# Patient Record
Sex: Female | Born: 1960 | Race: White | Hispanic: No | Marital: Married | Smoking: Never smoker
Health system: Southern US, Academic
[De-identification: ages and names within clinical notes are randomized; demographics above are authoritative.]

## PROBLEM LIST (undated history)

## (undated) DIAGNOSIS — K219 Gastro-esophageal reflux disease without esophagitis: Secondary | ICD-10-CM

## (undated) DIAGNOSIS — M810 Age-related osteoporosis without current pathological fracture: Secondary | ICD-10-CM

## (undated) DIAGNOSIS — E782 Mixed hyperlipidemia: Secondary | ICD-10-CM

## (undated) DIAGNOSIS — J449 Chronic obstructive pulmonary disease, unspecified: Secondary | ICD-10-CM

## (undated) DIAGNOSIS — Z86010 Personal history of colonic polyps: Secondary | ICD-10-CM

## (undated) DIAGNOSIS — J45909 Unspecified asthma, uncomplicated: Secondary | ICD-10-CM

## (undated) DIAGNOSIS — R000 Tachycardia, unspecified: Secondary | ICD-10-CM

## (undated) DIAGNOSIS — K5792 Diverticulitis of intestine, part unspecified, without perforation or abscess without bleeding: Secondary | ICD-10-CM

## (undated) DIAGNOSIS — M779 Enthesopathy, unspecified: Secondary | ICD-10-CM

## (undated) DIAGNOSIS — I959 Hypotension, unspecified: Secondary | ICD-10-CM

## (undated) DIAGNOSIS — E559 Vitamin D deficiency, unspecified: Secondary | ICD-10-CM

## (undated) HISTORY — PX: HX APPENDECTOMY: SHX54

## (undated) HISTORY — DX: Age-related osteoporosis without current pathological fracture: M81.0

## (undated) HISTORY — PX: HX GALL BLADDER SURGERY/CHOLE: SHX55

## (undated) HISTORY — DX: Tachycardia, unspecified: R00.0

## (undated) HISTORY — DX: Unspecified asthma, uncomplicated: J45.909

## (undated) HISTORY — PX: COLONOSCOPY: SHX174

## (undated) HISTORY — DX: Mixed hyperlipidemia: E78.2

## (undated) HISTORY — PX: HX TONSIL AND ADENOIDECTOMY: SHX28

## (undated) HISTORY — PX: HX TONSILLECTOMY: SHX27

## (undated) HISTORY — DX: Vitamin D deficiency, unspecified: E55.9

## (undated) HISTORY — DX: Hypotension, unspecified: I95.9

## (undated) HISTORY — DX: Gastro-esophageal reflux disease without esophagitis: K21.9

## (undated) HISTORY — PX: HX HYSTERECTOMY: SHX81

## (undated) HISTORY — DX: Chronic obstructive pulmonary disease, unspecified (CMS HCC): J44.9

## (undated) HISTORY — DX: Personal history of colonic polyps: Z86.010

---

## 1993-03-29 ENCOUNTER — Other Ambulatory Visit (HOSPITAL_COMMUNITY): Payer: Self-pay

## 2021-09-06 ENCOUNTER — Other Ambulatory Visit (HOSPITAL_COMMUNITY): Payer: Self-pay | Admitting: Obstetrics & Gynecology

## 2021-09-06 DIAGNOSIS — N644 Mastodynia: Secondary | ICD-10-CM

## 2021-09-10 ENCOUNTER — Other Ambulatory Visit (HOSPITAL_COMMUNITY): Payer: Self-pay | Admitting: Obstetrics & Gynecology

## 2021-09-10 DIAGNOSIS — N644 Mastodynia: Secondary | ICD-10-CM

## 2021-09-12 ENCOUNTER — Ambulatory Visit (HOSPITAL_COMMUNITY): Payer: Self-pay

## 2021-10-17 ENCOUNTER — Ambulatory Visit (HOSPITAL_COMMUNITY): Payer: Self-pay

## 2021-11-29 DIAGNOSIS — Z1211 Encounter for screening for malignant neoplasm of colon: Secondary | ICD-10-CM | POA: Insufficient documentation

## 2021-11-29 DIAGNOSIS — K219 Gastro-esophageal reflux disease without esophagitis: Secondary | ICD-10-CM | POA: Insufficient documentation

## 2021-11-29 DIAGNOSIS — R1310 Dysphagia, unspecified: Secondary | ICD-10-CM | POA: Insufficient documentation

## 2021-11-29 NOTE — H&P (View-Only) (Signed)
GENERAL SURGERY, [address] [facility] [address]  [place] [id number]       Name: Melinda Berg MRN:  [id number]   Date: 12/03/2021 Age: 61 y.o. 07-01-1960      PCP: Jonn Shingles, NP     Subjective  Melinda Berg is a 61 y.o. year old female who presents for complaints of ongoing left upper quadrant abdominal pain radiating to her left flank.  She states she was told in the past this related to a "hiatal hernia. "  Has had previous EGDs to evaluate this but has been greater than 10 years since her last.  Several of these EGDs involved esophageal dilatations.  Has ongoing gastroesophageal reflux symptoms.  She takes proton pump inhibitors for this with some control of her symptoms.  Her left upper quadrant abdominal pain is unrelated to oral intake.      In addition has not had a colonoscopy in greater than 10 years.  States she had colorectal polyps on her last exam.  Denies rectal bleeding.  No unexplained weight loss.  No family history of colon cancer.    Medical decision  Review of the result(s) of each unique test:  Patient underwent diagnostic testing (previous endoscopy report) prior to this dates visit.  I have personally reviewed the results and that serves as a component of the medical decision making for this encounter     Review of prior external note(s) from each unique source:  Patients referral to this office including a recent assessment by the referring provider.  This was reviewed by me for this unique office visit for the indication and intent of the referral as well as any pertinent medical or surgical history relevant to the patients independent evaluation by me today.     Allergies   Allergen Reactions   . Meperidine Shortness of Breath   . Penicillins Rash   . Acetaminophen    . Aspirin-Acetaminophen-Caffeine    . Butorphanol      dizzy      Current Outpatient Medications   Medication Sig   . alendronate (FOSAMAX) 70 mg Oral Tablet    . EPINEPHrine 0.3 mg/0.3  mL Injection Auto-Injector Inject 0.3 mL (0.3 mg total) into the muscle   . estradioL (ESTRACE) 1 mg Oral Tablet    . famotidine (PEPCID) 40 mg Oral Tablet    . furosemide (LASIX) 80 mg Oral Tablet    . metoprolol succinate (TOPROL-XL) 50 mg Oral Tablet Sustained Release 24 hr    . midodrine (PROAMITINE) 5 mg Oral Tablet    . nitrofurantoin monohyd/m-cryst (MACROBID) 100 mg Oral Capsule    . pantoprazole (PROTONIX) 40 mg Oral Tablet, Delayed Release (E.C.)    . potassium chloride (K-DUR) 20 mEq Oral Tab Sust.Rel. Particle/Crystal    . sodium,potassium,mag sulfates (SUPREP BOWEL PREP KIT) 17.5-3.13-1.6 gram Oral Recon Soln Take 1 Bottle by mouth Every 12 hours   . VITAMIN D 1,250 mcg (50,000 unit) Oral Capsule           Objective:       Vitals:    12/03/21 1427   BP: 126/80   Pulse: 89   Temp: 36.8 C (98.2 F)   SpO2: 95%   Weight: 68.1 kg (150 lb 3.2 oz)   Height: 1.6 m (_0 )   BMI: 26.66        General: appropriate for age. in no acute distress.    Vital signs are present above  and have been reviewed by me     HEENT: Atraumatic, Normocephalic.    Lungs: Nonlabored breathing with symmetric expansion    Heart:Regular wth respect to rate and rythmn.    Abdomen:Soft. Nontender. Nondistended     Psychiatric: Alert and oriented to person, place, and time. affect appropriate    Assessment/Plan    ICD-10-CM    1. GERD (gastroesophageal reflux disease)  K21.9       2. Screening for malignant neoplasm of colon  Z12.11            Discussed indications, risks and benefits of esophagogastroduodenoscopy and colonoscopy with the patient.  Discussed the possibility of polypectomy, biopsies, and possible repeat examinations.  Risks include bleeding, sedation risks, possibility of missed diagnosis of polyp or malignancy, and remote possibilities of perforation and death.  All questions were answered, and informed consent was clearly obtained.    Office Visit was used for detailed explanation procedure and its indications, review  of the patient's medications relative to the time before and after the procedure, and the effects of the associated medical conditions that affect the procedure preparation and procedure itself.    Bridgett Larsson MD MBA CPE FACS

## 2021-11-29 NOTE — H&P (Signed)
GENERAL SURGERY, [address] [facility] [address]  [place] [id number]       Name: MONSERATH NEFF MRN:  [id number]   Date: 12/03/2021 Age: 61 y.o. 07-01-1960      PCP: Jonn Shingles, NP     Subjective  Melinda Berg is a 61 y.o. year old female who presents for complaints of ongoing left upper quadrant abdominal pain radiating to her left flank.  She states she was told in the past this related to a "hiatal hernia. "  Has had previous EGDs to evaluate this but has been greater than 10 years since her last.  Several of these EGDs involved esophageal dilatations.  Has ongoing gastroesophageal reflux symptoms.  She takes proton pump inhibitors for this with some control of her symptoms.  Her left upper quadrant abdominal pain is unrelated to oral intake.      In addition has not had a colonoscopy in greater than 10 years.  States she had colorectal polyps on her last exam.  Denies rectal bleeding.  No unexplained weight loss.  No family history of colon cancer.    Medical decision  Review of the result(s) of each unique test:  Patient underwent diagnostic testing (previous endoscopy report) prior to this dates visit.  I have personally reviewed the results and that serves as a component of the medical decision making for this encounter     Review of prior external note(s) from each unique source:  Patients referral to this office including a recent assessment by the referring provider.  This was reviewed by me for this unique office visit for the indication and intent of the referral as well as any pertinent medical or surgical history relevant to the patients independent evaluation by me today.     Allergies   Allergen Reactions   . Meperidine Shortness of Breath   . Penicillins Rash   . Acetaminophen    . Aspirin-Acetaminophen-Caffeine    . Butorphanol      dizzy      Current Outpatient Medications   Medication Sig   . alendronate (FOSAMAX) 70 mg Oral Tablet    . EPINEPHrine 0.3 mg/0.3  mL Injection Auto-Injector Inject 0.3 mL (0.3 mg total) into the muscle   . estradioL (ESTRACE) 1 mg Oral Tablet    . famotidine (PEPCID) 40 mg Oral Tablet    . furosemide (LASIX) 80 mg Oral Tablet    . metoprolol succinate (TOPROL-XL) 50 mg Oral Tablet Sustained Release 24 hr    . midodrine (PROAMITINE) 5 mg Oral Tablet    . nitrofurantoin monohyd/m-cryst (MACROBID) 100 mg Oral Capsule    . pantoprazole (PROTONIX) 40 mg Oral Tablet, Delayed Release (E.C.)    . potassium chloride (K-DUR) 20 mEq Oral Tab Sust.Rel. Particle/Crystal    . sodium,potassium,mag sulfates (SUPREP BOWEL PREP KIT) 17.5-3.13-1.6 gram Oral Recon Soln Take 1 Bottle by mouth Every 12 hours   . VITAMIN D 1,250 mcg (50,000 unit) Oral Capsule           Objective:       Vitals:    12/03/21 1427   BP: 126/80   Pulse: 89   Temp: 36.8 C (98.2 F)   SpO2: 95%   Weight: 68.1 kg (150 lb 3.2 oz)   Height: 1.6 m (_0 )   BMI: 26.66        General: appropriate for age. in no acute distress.    Vital signs are present above  and have been reviewed by me     HEENT: Atraumatic, Normocephalic.    Lungs: Nonlabored breathing with symmetric expansion    Heart:Regular wth respect to rate and rythmn.    Abdomen:Soft. Nontender. Nondistended     Psychiatric: Alert and oriented to person, place, and time. affect appropriate    Assessment/Plan    ICD-10-CM    1. GERD (gastroesophageal reflux disease)  K21.9       2. Screening for malignant neoplasm of colon  Z12.11            Discussed indications, risks and benefits of esophagogastroduodenoscopy and colonoscopy with the patient.  Discussed the possibility of polypectomy, biopsies, and possible repeat examinations.  Risks include bleeding, sedation risks, possibility of missed diagnosis of polyp or malignancy, and remote possibilities of perforation and death.  All questions were answered, and informed consent was clearly obtained.    Office Visit was used for detailed explanation procedure and its indications, review  of the patient's medications relative to the time before and after the procedure, and the effects of the associated medical conditions that affect the procedure preparation and procedure itself.    Bridgett Larsson MD MBA CPE FACS

## 2021-11-30 ENCOUNTER — Encounter (INDEPENDENT_AMBULATORY_CARE_PROVIDER_SITE_OTHER): Payer: Self-pay | Admitting: Surgery

## 2021-12-03 ENCOUNTER — Other Ambulatory Visit: Payer: Self-pay

## 2021-12-03 ENCOUNTER — Encounter (INDEPENDENT_AMBULATORY_CARE_PROVIDER_SITE_OTHER): Payer: Self-pay | Admitting: Surgery

## 2021-12-03 ENCOUNTER — Ambulatory Visit (INDEPENDENT_AMBULATORY_CARE_PROVIDER_SITE_OTHER): Payer: BC Managed Care – PPO | Admitting: Surgery

## 2021-12-03 DIAGNOSIS — R1310 Dysphagia, unspecified: Secondary | ICD-10-CM

## 2021-12-03 DIAGNOSIS — Z1211 Encounter for screening for malignant neoplasm of colon: Secondary | ICD-10-CM

## 2021-12-03 DIAGNOSIS — K219 Gastro-esophageal reflux disease without esophagitis: Secondary | ICD-10-CM

## 2021-12-03 MED ORDER — SODIUM,POTASSIUM,MAG SULFATES 17.5 GRAM-3.13 GRAM-1.6 GRAM ORAL SOLN
1.0000 | Freq: Two times a day (BID) | ORAL | 0 refills | Status: DC
Start: 2021-12-03 — End: 2021-12-13

## 2021-12-13 ENCOUNTER — Encounter (HOSPITAL_COMMUNITY): Payer: BC Managed Care – PPO | Admitting: Surgery

## 2021-12-13 ENCOUNTER — Other Ambulatory Visit: Payer: Self-pay

## 2021-12-13 ENCOUNTER — Inpatient Hospital Stay
Admission: RE | Admit: 2021-12-13 | Discharge: 2021-12-13 | Disposition: A | Discharge status: Home / Self Care | Payer: BC Managed Care – PPO | Source: Ambulatory Visit | Attending: Surgery | Admitting: Surgery

## 2021-12-13 ENCOUNTER — Encounter (HOSPITAL_COMMUNITY): Payer: Self-pay | Admitting: Surgery

## 2021-12-13 ENCOUNTER — Encounter (HOSPITAL_COMMUNITY)
Admission: RE | Disposition: A | Discharge status: Home / Self Care | Payer: Self-pay | Source: Ambulatory Visit | Attending: Surgery

## 2021-12-13 ENCOUNTER — Ambulatory Visit (HOSPITAL_COMMUNITY): Payer: BC Managed Care – PPO | Admitting: Certified Registered"

## 2021-12-13 DIAGNOSIS — R1310 Dysphagia, unspecified: Secondary | ICD-10-CM | POA: Insufficient documentation

## 2021-12-13 DIAGNOSIS — K219 Gastro-esophageal reflux disease without esophagitis: Secondary | ICD-10-CM | POA: Insufficient documentation

## 2021-12-13 DIAGNOSIS — Z1211 Encounter for screening for malignant neoplasm of colon: Secondary | ICD-10-CM | POA: Insufficient documentation

## 2021-12-13 DIAGNOSIS — K5730 Diverticulosis of large intestine without perforation or abscess without bleeding: Secondary | ICD-10-CM | POA: Insufficient documentation

## 2021-12-13 DIAGNOSIS — K2950 Unspecified chronic gastritis without bleeding: Secondary | ICD-10-CM | POA: Insufficient documentation

## 2021-12-13 DIAGNOSIS — Z9889 Other specified postprocedural states: Secondary | ICD-10-CM

## 2021-12-13 DIAGNOSIS — K449 Diaphragmatic hernia without obstruction or gangrene: Secondary | ICD-10-CM | POA: Insufficient documentation

## 2021-12-13 DIAGNOSIS — K2970 Gastritis, unspecified, without bleeding: Secondary | ICD-10-CM

## 2021-12-13 DIAGNOSIS — K648 Other hemorrhoids: Secondary | ICD-10-CM | POA: Insufficient documentation

## 2021-12-13 HISTORY — DX: Diverticulitis of intestine, part unspecified, without perforation or abscess without bleeding: K57.92

## 2021-12-13 HISTORY — DX: Enthesopathy, unspecified: M77.9

## 2021-12-13 SURGERY — GASTROSCOPY WITH BIOPSY
Anesthesia: General | Wound class: Clean Contaminated Wounds-The respiratory, GI, Genital, or urinary

## 2021-12-13 MED ORDER — PROPOFOL 10 MG/ML INTRAVENOUS EMULSION
Freq: Once | INTRAVENOUS | Status: DC | PRN
Start: 2021-12-13 — End: 2021-12-13
  Administered 2021-12-13: 200 mg via INTRAVENOUS

## 2021-12-13 MED ORDER — PROPOFOL 10 MG/ML IV BOLUS
INJECTION | Freq: Once | INTRAVENOUS | Status: DC | PRN
Start: 2021-12-13 — End: 2021-12-13
  Administered 2021-12-13: 50 mg via INTRAVENOUS

## 2021-12-13 MED ORDER — LIDOCAINE (PF) 100 MG/5 ML (2 %) INTRAVENOUS SYRINGE
INJECTION | Freq: Once | INTRAVENOUS | Status: DC | PRN
Start: 2021-12-13 — End: 2021-12-13
  Administered 2021-12-13: 90 mg via INTRAVENOUS

## 2021-12-13 MED ORDER — SODIUM CHLORIDE 0.9 % INTRAVENOUS SOLUTION
INTRAVENOUS | Status: DC
Start: 2021-12-13 — End: 2021-12-13

## 2021-12-13 SURGICAL SUPPLY — 5 items
FORCEPS BIOPSY MICROMESH TTH STREAMLINE CATH NEEDLE 240CM 2.4MM RJ 4 SS LRG CPC STRL DISP ORNG 2.8MM (ENDOSCOPIC SUPPLIES) ×1 IMPLANT
FORCEPS BIOPSY NEEDLE 240CM 2._4MM RJ 4 2.8MM LRG CPC STRL (INSTRUMENTS ENDOMECHANICAL) ×1
USE ITEM 60432 FORCEPS BIOPSY MICROMESH TTH STREAMLINE CATH NEEDLE 240CM 2.4MM RJ 4 SS LRG CPC STRL DISP ORNG 2.8MM (ENDOSCOPIC SUPPLIES) ×1 IMPLANT
VALVE AIR/H20 DEFENDO BUTTON KIT SUCT BIOPSY STRL DISP (ENDOSCOPIC SUPPLIES) ×1 IMPLANT
VALVE AIR/H20 DEFENDO BUTTON KIT SUCT BIOPSY STRL DISP (INSTRUMENTS ENDOMECHANICAL) ×1

## 2021-12-13 NOTE — Interval H&P Note (Signed)
[facility]      H&P UPDATE FORM                                                                                  [place], [place], 61 y.o. female  Date of Admission:  12/13/2021  Date of Birth:  09-12-1960    12/13/2021    STOP: IF H&P IS GREATER THAN 30 DAYS FROM SURGICAL DAY COMPLETE NEW H&P IS REQUIRED.     H & P updated the day of the procedure.  1.  H&P completed within 30 days of surgical procedure and has been reviewed within 24 hours of admission but prior to surgery or a procedure requiring anesthesia services, the patient has been examined, and no change has occured in the patients condition since the H&P was completed.       Change in medications: No        No LMP recorded. Patient has had a hysterectomy.      Comments:     2.  Patient continues to be appropriate candidate for planned surgical procedure. YES    Esmond Plants, MD

## 2021-12-13 NOTE — OR Surgeon (Signed)
[facility]      Patient Name: Cynitha, Berte Number: [id number]  Date of Service: 12/13/2021   Date of Birth: 1960-08-15      Pre-Operative Diagnosis: TROUBLE SWALLOWING;  GERD;SCREENING     Post-Operative Diagnosis: Gastritis  Previous fundoplication with residual Hiatal Hernia  Pan colonic diverticulosis  Internal Hemorrhoids    Procedure(s)/Description:  EGD WITH BIOPSY: 43239 (CPT)  COLONOSCOPY WITH Random colon BIOPSY: 45380 (CPT)     Attending Surgeon: Esmond Plants, MD     Anesthesia:  CRNA: Jenne Pane, CRNA    Anesthesia Type: .General     Specimen:  Antrum, random colonic biopsy    Patient was taken to the endoscopy suite.  Given appropriate intravenous sedation.  Video gastroscope inserted into the posterior pharynx and directed distally into the esophagus.  Esophagus was transversed and the stomach entered without difficulty.  The stomach was insufflated with air.  Scope was advanced to the level of the pylorus.  Pylorus was cannulated.  The first and second portion of the duodenum visualized without evidence of any abnormality.  Scope was withdrawn back into the distal stomach.  Single biopsy was taken for H. pylori at this level.  There were mild changes of superficial gastritis noted within the antrum.  The scope was withdrawn back.  The body and the fundus showed no specific abnormality.  The scope was retroflexed.  The gastroesophageal junction visualized from below.  There was signs of a previous fundoplication with moderate-size residual hiatal hernia.   Scope was then withdrawn back to the main course of the esophageal lumen.  There is no significant irregularity at the level of the squamocolumnar junction.  No signs of esophagitis.  There is no luminal constriction, or any other abnormality noted upon withdrawing back through the esophageal lumen.  Scope was then withdrawn, and this concluded the procedure patient tolerated well.  Patient was turned.   Videocolonoscope was inserted into the rectum and advanced sequentially to the level of the cecum.  Cecum was confirmed by external palpation, presence of the ileocecal valve and transillumination of the light.  Picture was taken that documents this level.  Colonoscope was then subsequently withdrawn back inspecting all mucosal surfaces. Scope was subsequently withdrawn back.  The patient had diverticular changes noted throughout the entire length of the colonic lumen.  They were slightly more concentrated on the left/sigmoid colon as compared to the right.  No other specific abnormality was noted.  Random colonic biopsies were taken during the withdrawal process.  No visible abnormality was noted but this was performed to rule out an underlying colitis.  The scope was withdrawn back to the level of the rectum and retroflexed.  The rectal anal junction visualized with internal hemorrhoids and no other specific abnormality.  Scope was then subsequently straightened and withdrawn.  This concluded the procedure which patient tolerated well.  Patient would not need another routine colonoscopy for 10 years barring any change in symptoms and/or presentation.      Maura Crandall MD MBA CPE FACS

## 2021-12-13 NOTE — Discharge Instructions (Addendum)
SURGICAL DISCHARGE INSTRUCTIONS     Dr. Esmond Plants, MD  performed your EGD WITH BIOPSY, COLONOSCOPY WITH Random colon BIOPSY today at the [facility]  Findings: EGD: Gastritis, Hiatal Hernia  COLONOSCOPY: Pan colonic diverticulosis, Internal Hemorrhoids    [facility]:  Monday through Friday from 8 a.m. - 4 p.m.: [phone]    For T&D: [phone]  Between 4 p.m. - 8 a.m., weekends and holidays:  Call ER [phone]    PLEASE SEE WRITTEN HANDOUTS AS DISCUSSED BY YOUR NURSE:  Jaydien Panepinto, RN      ANESTHESIA INFORMATION   ANESTHESIA -- ADULT PATIENTS:  You have received intravenous sedation / general anesthesia, and you may feel drowsy and light-headed for several hours. You may even experience some forgetfulness of the procedure. DO NOT DRIVE A MOTOR VEHICLE or perform any activity requiring complete alertness or coordination until you feel fully awake in about 24-48 hours. Do not drink alcoholic beverages for at least 24 hours. Do not stay alone, you must have a responsible adult available to be with you. You may also experience a dry mouth or nausea for 24 hours. This is a normal side effect and will disappear as the effects of the medication wear off.    REMEMBER   If you experience any difficulty breathing, chest pain, bleeding that you feel is excessive, persistent nausea or vomiting or for any other concerns:  Call your physician Dr. Esmond Plants, MD at [phone] . You may also ask to have the general doctor on call paged. They are available to you 24 hours a day.    Drink plenty of water and eat a high fiber diet.  Follow up with Dr. Sabino Gasser on 12/31/21 at 10:45am.

## 2021-12-13 NOTE — Anesthesia Preprocedure Evaluation (Signed)
ANESTHESIA PRE-OP EVALUATION  Planned Procedure: EGD WITH BIOPSY  COLONOSCOPY  Review of Systems     anesthesia history negative     patient summary reviewed          Pulmonary     Cardiovascular    No peripheral edema,  Exercise Tolerance: > or = 4 METS        GI/Hepatic/Renal           Endo/Other         Neuro/Psych/MS        Cancer                      Physical Assessment      Airway       Mallampati: I    TM distance: >3 FB    Neck ROM: full  Mouth Opening: good.  No Facial hair          Dental                    Pulmonary    Breath sounds clear to auscultation  (-) no rhonchi, no decreased breath sounds, no wheezes, no rales and no stridor     Cardiovascular    Rhythm: regular  Rate: Normal  (-) no friction rub, carotid bruit is not present, no peripheral edema and no murmur     Other findings            Plan  ASA 2     Planned anesthesia type: general     total intravenous anesthesia                  Intravenous induction     Anesthesia issues/risks discussed are: PONV.  Anesthetic plan and risks discussed with patient  Signed consent obtained            Patient's NPO status is appropriate for Anesthesia.

## 2021-12-13 NOTE — Anesthesia Postprocedure Evaluation (Signed)
Anesthesia Post Op Evaluation    Patient: Melinda Berg  Procedure(s):  EGD WITH BIOPSY  COLONOSCOPY WITH Random colon BIOPSY    Last Vitals:Temperature: 36.6 C (97.8 F) (12/13/21 1233)  Heart Rate: 63 (12/13/21 1233)  BP (Non-Invasive): 98/63 (12/13/21 1233)  Respiratory Rate: 16 (12/13/21 1233)  SpO2: 99 % (12/13/21 1233)    No notable events documented.    Patient is sufficiently recovered from the effects of anesthesia to participate in the evaluation and has returned to their pre-procedure level.  Patient location during evaluation: PACU       Patient participation: complete - patient participated  Level of consciousness: responsive to verbal stimuli and sleepy but conscious    Pain score: 0  Pain management: adequate  Airway patency: patent    Anesthetic complications: no  Cardiovascular status: acceptable  Respiratory status: acceptable  Hydration status: acceptable  Patient post-procedure temperature: Pt Normothermic   PONV Status: Absent

## 2021-12-14 DIAGNOSIS — K5730 Diverticulosis of large intestine without perforation or abscess without bleeding: Secondary | ICD-10-CM

## 2021-12-14 DIAGNOSIS — K2950 Unspecified chronic gastritis without bleeding: Secondary | ICD-10-CM

## 2021-12-14 LAB — SURGICAL PATHOLOGY SPECIMEN

## 2021-12-29 NOTE — Progress Notes (Deleted)
[facility]  GENERAL SURGERY, [facility] GENERAL SURGERY    Progress Note    Name: Melinda Berg MRN:  [id number]   Date: 12/31/2021 Age: 61 y.o.          Date of Birth:  [birth year]  PCP: Adah Salvage, NP  Referring:  No ref. provider found     HPI:  Melinda Berg is a 61 y.o. White female who returns following EGD and colonoscopy performed by me December 13, 2021.  This showed gastritis, previous fundoplication with a residual hiatal hernia, pancolonic diverticulosis and internal hemorrhoids.  Tolerated procedure without difficulty.          Past Medical History:   Diagnosis Date    Asthma     Bone spur of other site     back    COPD (chronic obstructive pulmonary disease) (CMS HCC)     Diverticulitis     Esophageal reflux     Hypotension     Mixed hyperlipidemia     Osteoporosis     Personal history of colonic polyps     Tachycardia     Vitamin D deficiency       Past Surgical History:   Procedure Laterality Date    COLONOSCOPY      HX APPENDECTOMY      HX CHOLECYSTECTOMY      HX HYSTERECTOMY      HX TONSIL AND ADENOIDECTOMY      HX TONSILLECTOMY        No outpatient medications have been marked as taking for the 12/31/21 encounter (Appointment) with Esmond Plants, MD.      Allergies   Allergen Reactions    Meperidine Shortness of Breath    Penicillins Rash    Acetaminophen     Aspirin-Acetaminophen-Caffeine     Butorphanol      dizzy           There were no vitals taken for this visit.         General: appropriate for age. in no acute distress.    Vital signs are present above and have been reviewed by me     HEENT: Atraumatic, Normocephalic.    Lungs: Nonlabored breathing with symmetric expansion    Heart:Regular wth respect to rate and rythmn.    Abdomen:Soft. Nontender. Nondistended     Psychiatric: Alert and oriented to person, place, and time. affect appropriate       Assessment/Plan:  Assessment/Plan   1. Gastroesophageal reflux disease without esophagitis         ***      This note was partially  created using voice recognition software and is inherently subject to errors including those of syntax and "sound alike " substitutions which may escape proof reading. In such instances, original meaning may be extrapolated by contextual derivation.    Maura Crandall MD MBA CPE FACS

## 2021-12-31 ENCOUNTER — Encounter (INDEPENDENT_AMBULATORY_CARE_PROVIDER_SITE_OTHER): Payer: Self-pay | Admitting: Surgery

## 2021-12-31 DIAGNOSIS — K219 Gastro-esophageal reflux disease without esophagitis: Secondary | ICD-10-CM

## 2022-05-02 ENCOUNTER — Other Ambulatory Visit (HOSPITAL_COMMUNITY): Payer: Self-pay | Admitting: Family

## 2022-05-02 DIAGNOSIS — Z1231 Encounter for screening mammogram for malignant neoplasm of breast: Secondary | ICD-10-CM

## 2022-05-06 IMAGING — MR MRI CERVICAL SPINE WITHOUT CONTRAST
4 of 5 series · 24 of 48 positions shown · IV contrast (gadolinium)
Comparison: None available.

﻿EXAM:  [id number]   MRI CERVICAL SPINE WITHOUT CONTRAST
INDICATION: Persistent neck pain. Bilateral shoulder pain.  No history of C-spine surgery.
TECHNIQUE: Multiplanar, multisequential MRI of the C-spine was performed without gadolinium contrast.

[Series 5: T2 · sagittal · 3.0mm · 0.75mm/px · 8 of 13 slices shown (1 of 2)]
[im 1/13]
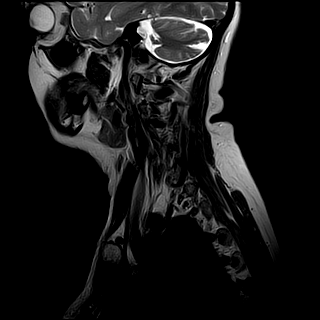
[im 2/13]
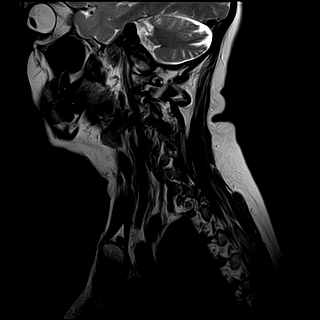
[im 4/13]
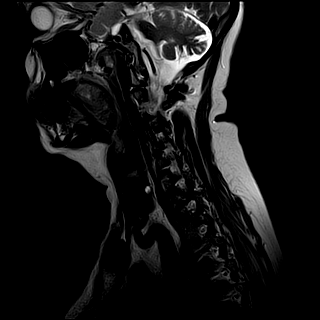
[im 6/13]
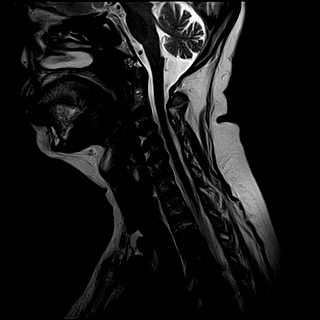
[im 7/13]
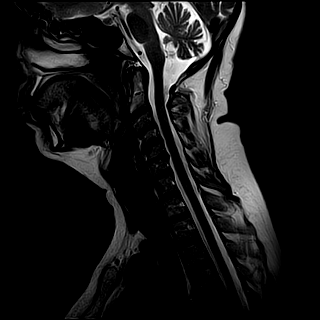
[im 9/13]
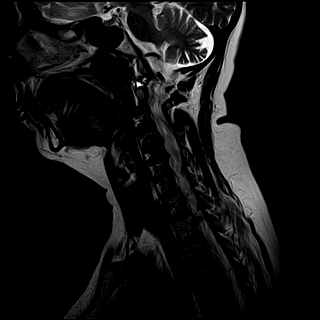
[im 11/13]
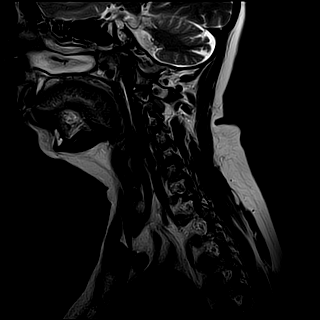
[im 13/13]
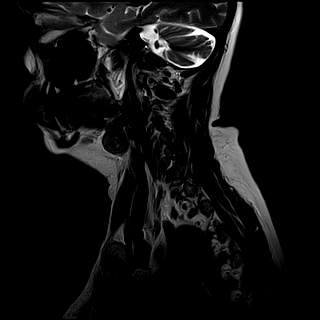

[Series 6: T1 · sagittal · 3.0mm · 0.47mm/px · 4 of 13 slices shown]
[im 1/13]
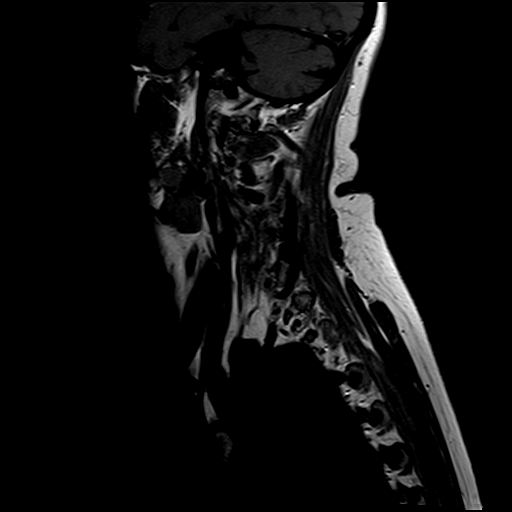
[im 2/13]
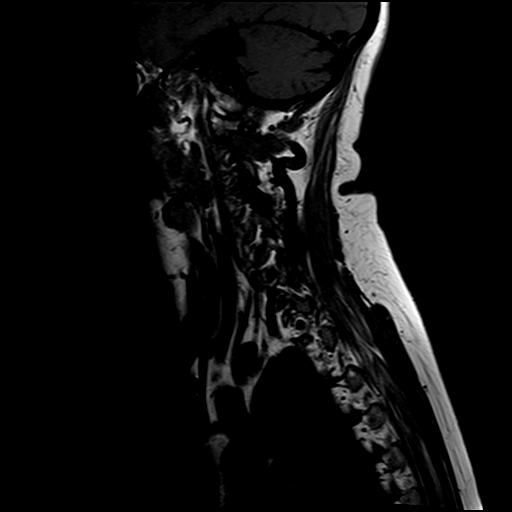
[im 7/13]
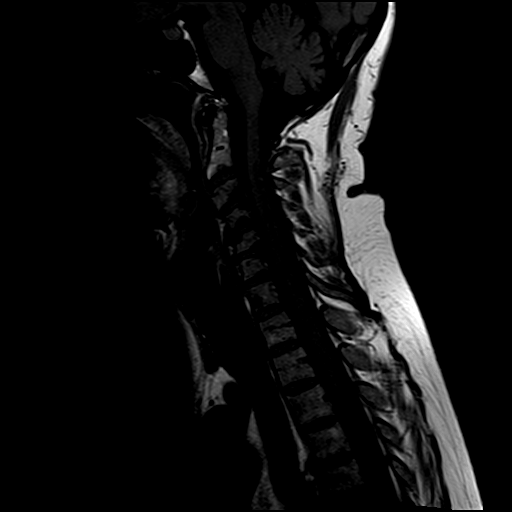
[im 11/13]
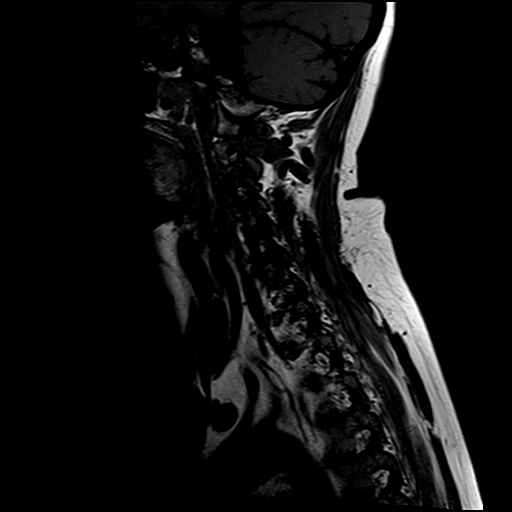

[Series 7: STIR · sagittal · 3.0mm · 0.47mm/px · 3 of 13 slices shown]
[im 2/13]
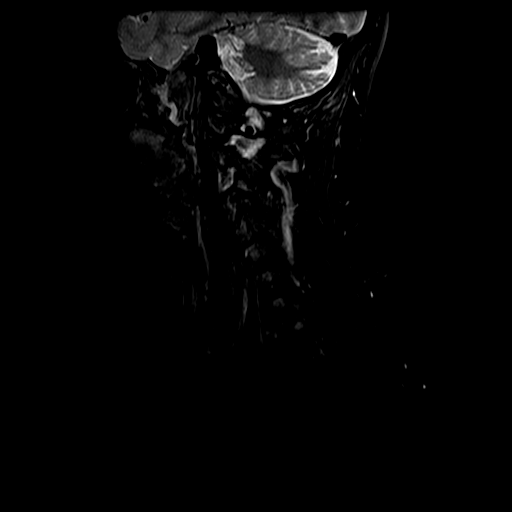
[im 7/13]
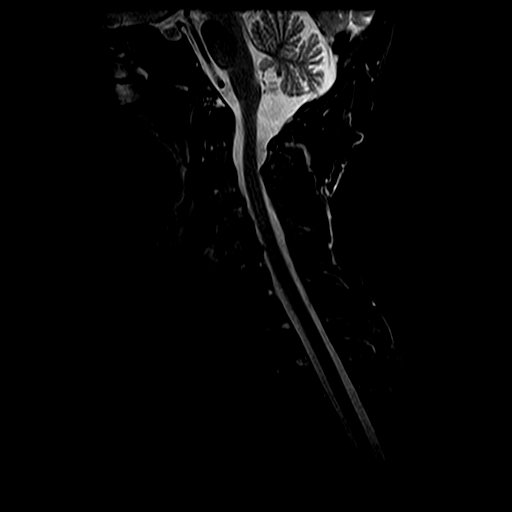
[im 11/13]
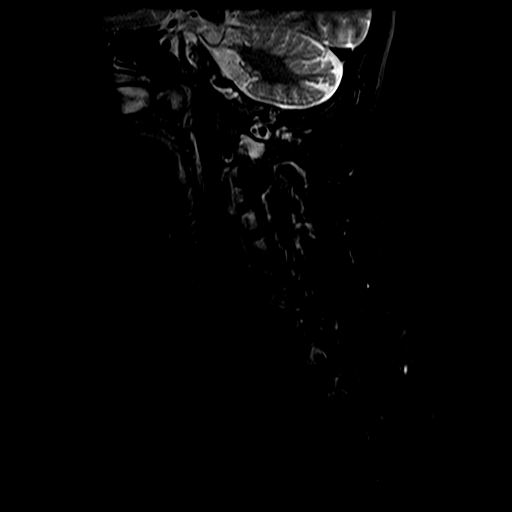

[Series 8: T2 · axial · 3.0mm · 0.39mm/px · z∈[-89,-23]mm · 9 of 18 slices shown (2 of 2)]
[im 1/18]
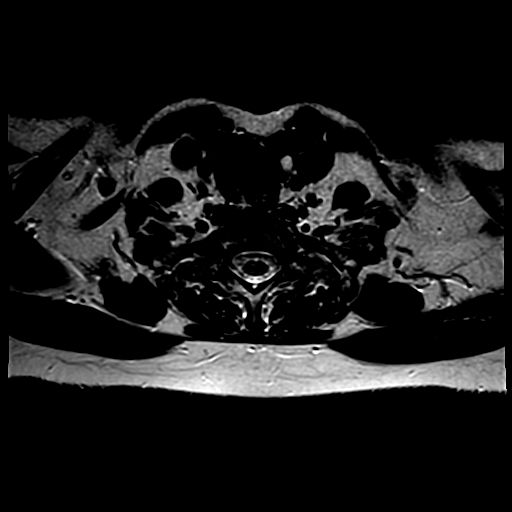
[im 4/18]
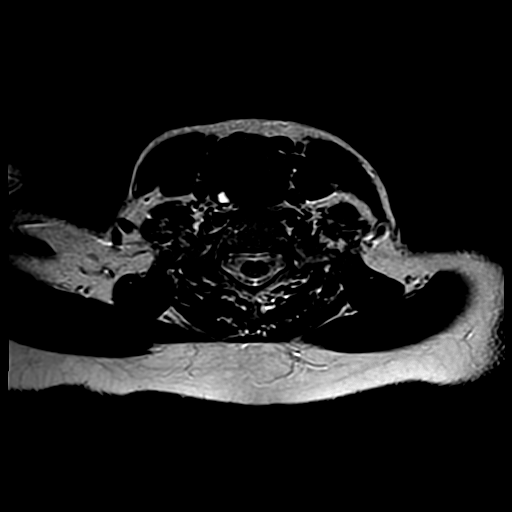
[im 5/18]
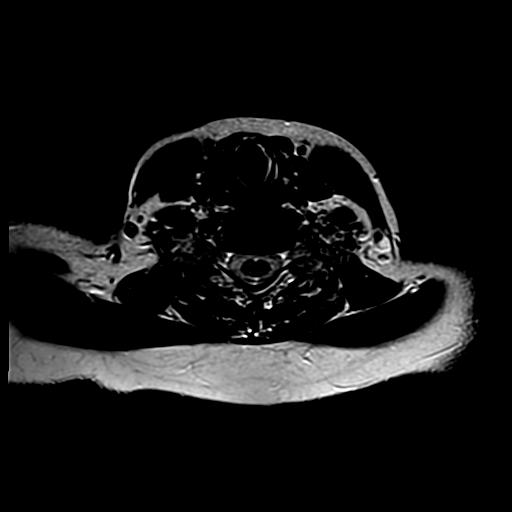
[im 8/18]
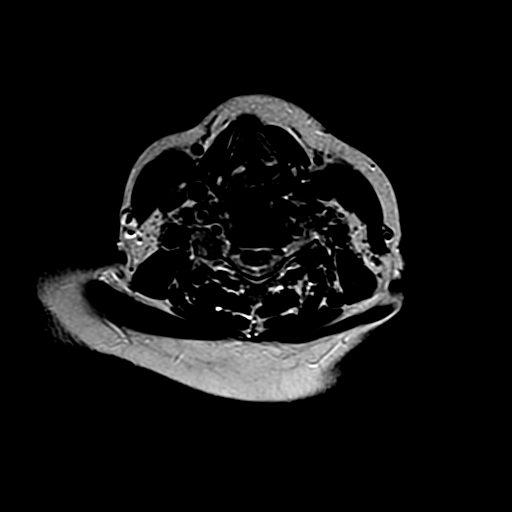
[im 10/18]
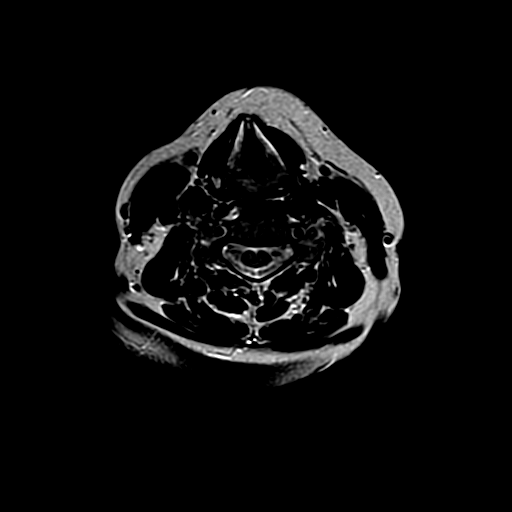
[im 13/18]
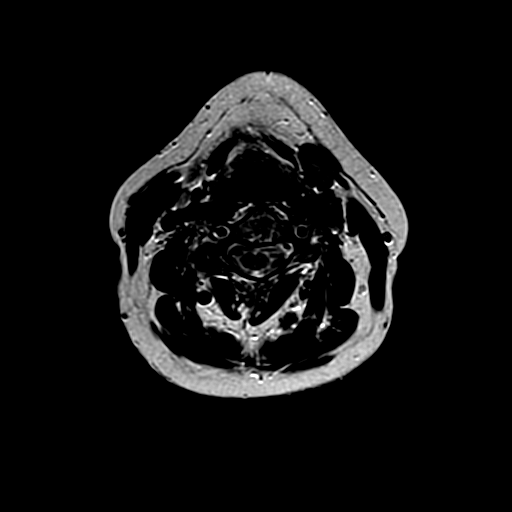
[im 14/18]
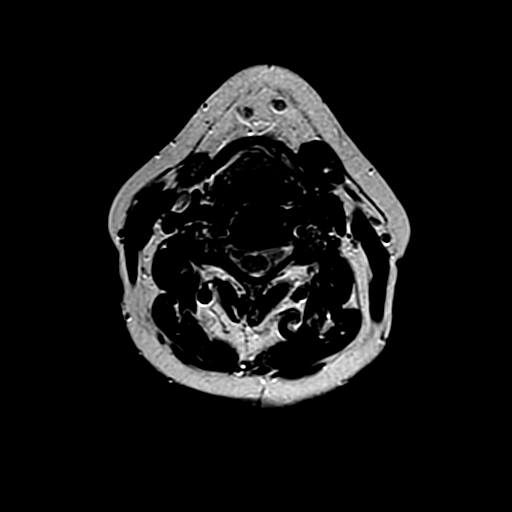
[im 16/18]
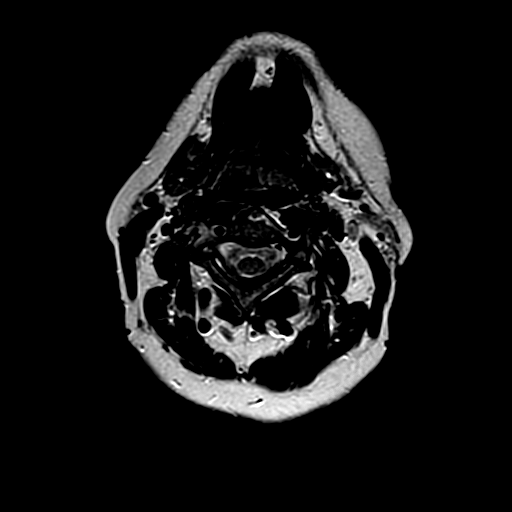
[im 18/18]
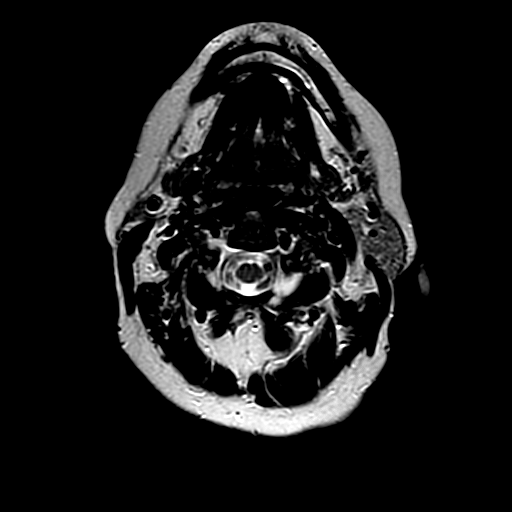

[24 of 48 positions shown; findings below may reference images not displayed]

FINDINGS: No acute bony lesions of cervical vertebrae are seen.  Posterior fossa and foramen magnum structures are normal. 

At C2-3 level, no focal disc lesions are seen. 

At C3-4 level, no focal disc lesions are seen. 

At C4-5 level, degenerative disc disease with bulging annulus is noted without compromise of thecal sac or neural foramina. 

At C5-6 level, degenerative disc disease with bulging annulus is noted with minimal compromise of thecal sac in the midline. No evidence of foraminal stenosis.

At C6-7 level, no focal disc lesions are seen. C7-T1 disc is normal. 

Paravertebral soft tissues are unremarkable.
IMPRESSION: 1. No acute bone changes of cervical vertebrae. 

2. Degenerative disc disease predominantly at C5-6 level as mentioned above.  No evidence of significant spinal stenosis or foraminal stenosis is seen.

3. No focal lesions of cervical spinal cord.  No focal lesions of paravertebral soft tissues.

## 2022-05-06 IMAGING — MR MRI LUMBAR SPINE WITHOUT CONTRAST
4 of 6 series · 31 of 48 positions shown · IV contrast (gadolinium)
Comparison: None available.

﻿EXAM:  [id number]   MRI LUMBAR SPINE WITHOUT CONTRAST
INDICATION: Chronic low back pain. Radicular symptoms to both hips and both legs.  No history of back surgery or malignancy.
TECHNIQUE: Multiplanar, multisequential MRI of the lumbosacral spine was performed without gadolinium contrast.

[Series 6: T1 · sagittal · 4.0mm · 0.83mm/px · 6 of 13 slices shown]
[im 1/13]
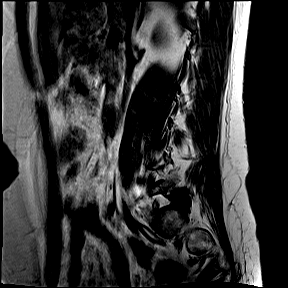
[im 3/13]
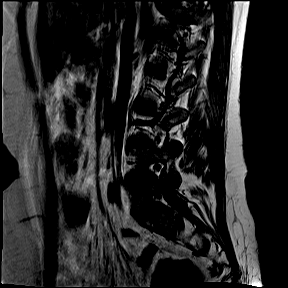
[im 5/13]
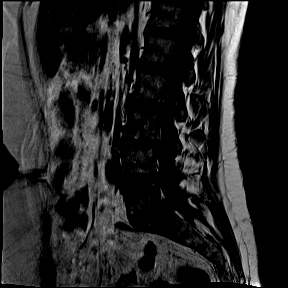
[im 8/13]
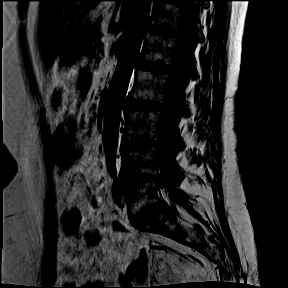
[im 10/13]
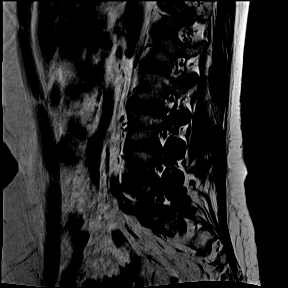
[im 13/13]
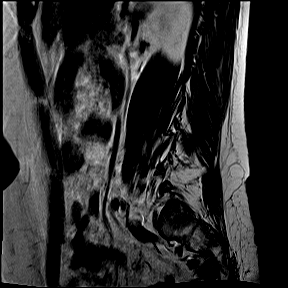

[Series 8: T2 · coronal · 5.0mm · 0.82mm/px · 8 of 18 slices shown (1 of 3)]
[im 1/18]
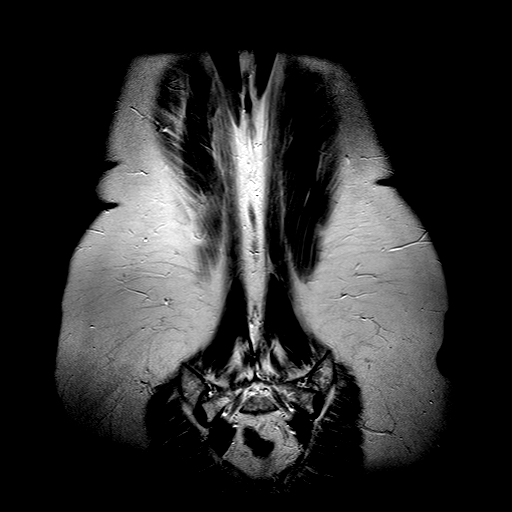
[im 3/18]
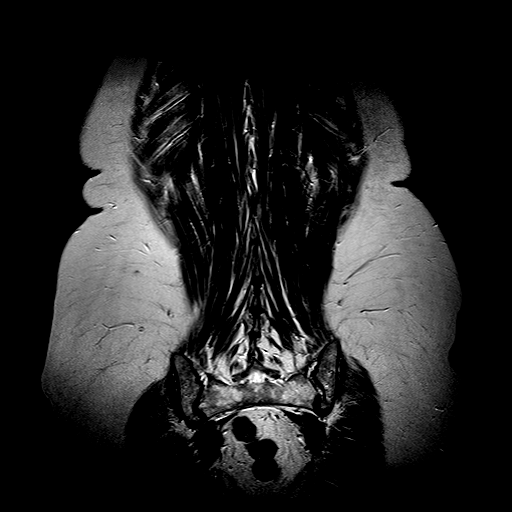
[im 5/18]
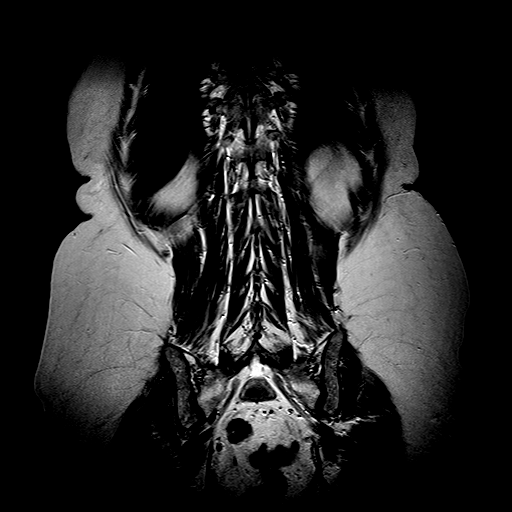
[im 8/18]
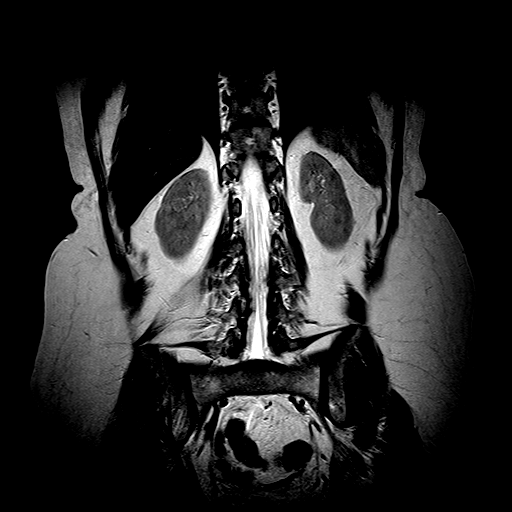
[im 10/18]
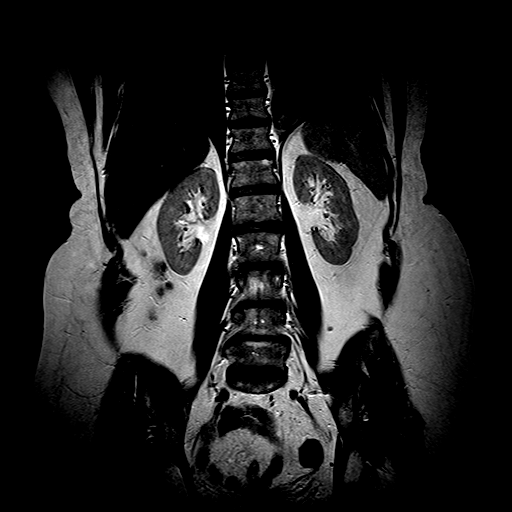
[im 13/18]
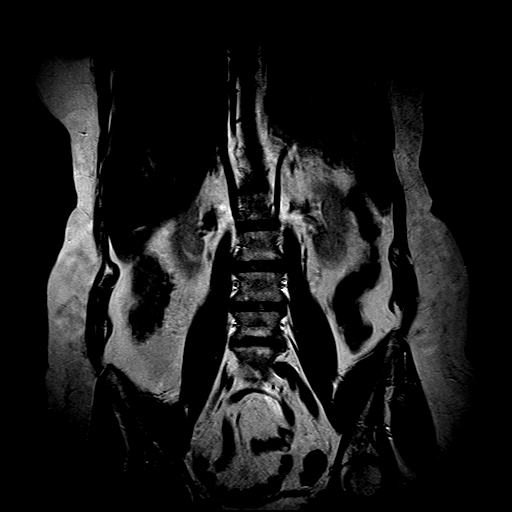
[im 15/18]
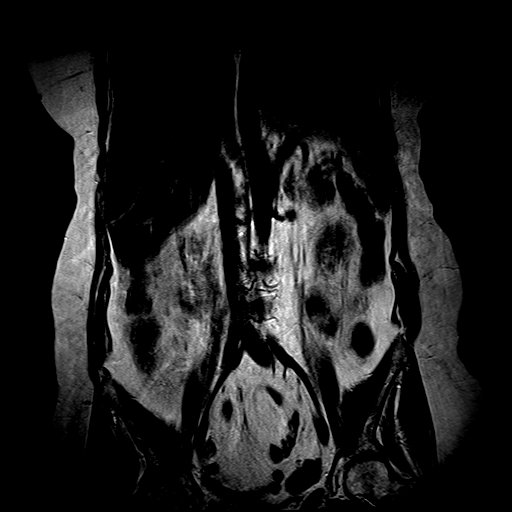
[im 18/18]
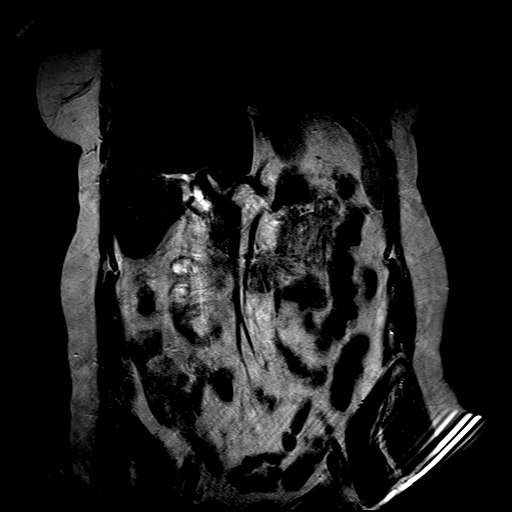

[Series 9: T2 · axial · 4.0mm · 0.52mm/px · z∈[-148,+41]mm · 11 of 23 slices shown (2 of 3)]
[im 1/23]
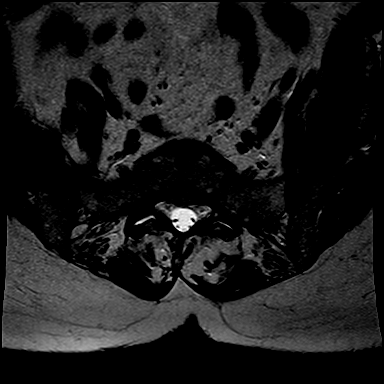
[im 3/23]
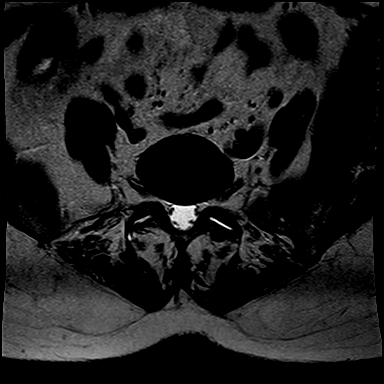
[im 5/23]
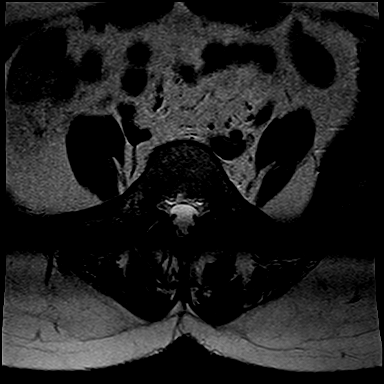
[im 7/23]
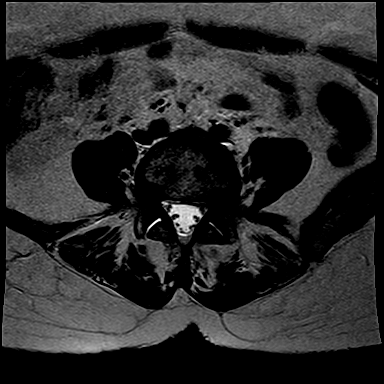
[im 9/23]
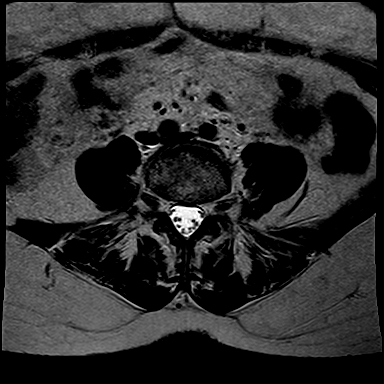
[im 12/23]
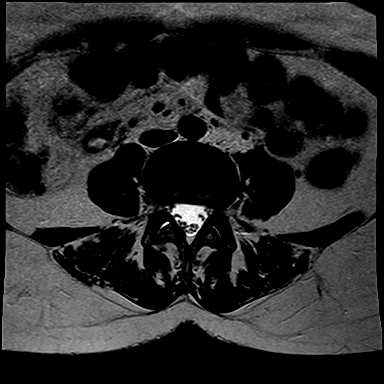
[im 14/23]
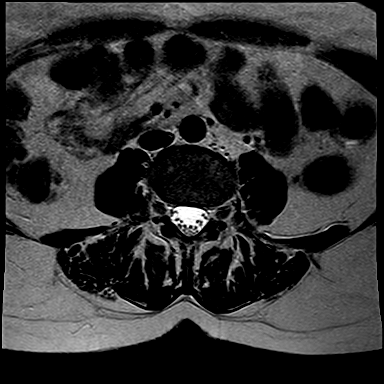
[im 16/23]
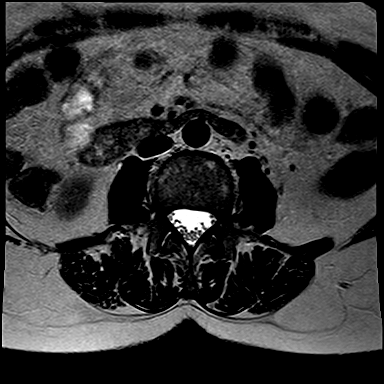
[im 18/23]
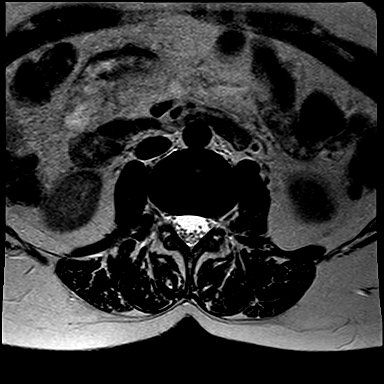
[im 20/23]
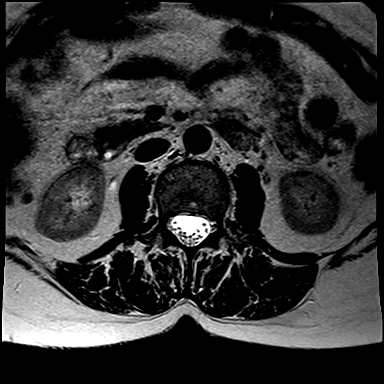
[im 23/23]
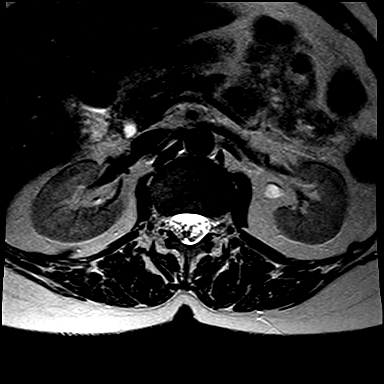

[Series 11: T2 · sagittal · 4.0mm · 0.83mm/px · 6 of 13 slices shown (3 of 3)]
[im 1/13]
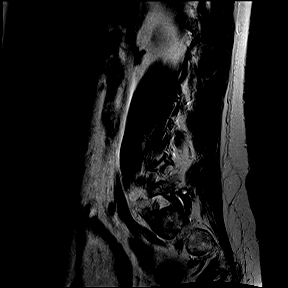
[im 3/13]
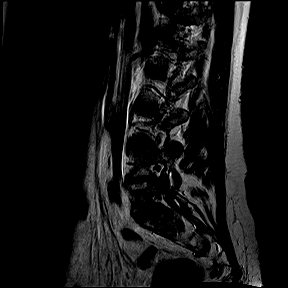
[im 5/13]
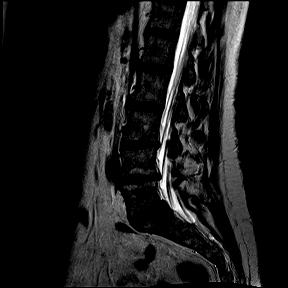
[im 8/13]
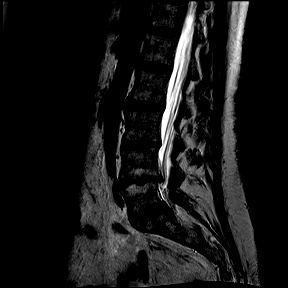
[im 10/13]
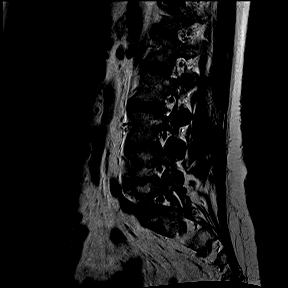
[im 13/13]
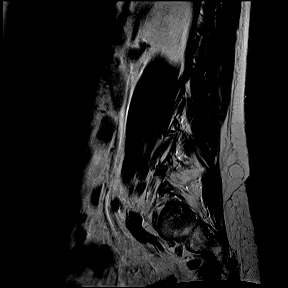

[31 of 48 positions shown; findings below may reference images not displayed]

FINDINGS: No acute or focal bone changes of lumbar vertebrae are seen.

Lower spinal cord and cauda equina are normal.

L1-2 disc is normal.  

No focal abnormalities of L2-3 disc are seen. 

At L3-4 level, mild degenerative disc disease with bulging annulus and facet arthropathy are causing mild bilateral foraminal narrowing and minimal compromise of thecal sac.

At L4-5 level, significant degenerative disc disease and bilateral facet arthropathy are noted with minimal retrolisthesis.  Bulging annulus and facet arthropathy are causing moderately significant biforaminal stenosis.  AP diameter of thecal sac in the midline measures 12.7 mm. 

At L5-S1 level, degenerative disc disease and facet arthropathy are causing moderate biforaminal narrowing. Minimal anterolisthesis of L5 on S1 is noted.

Paravertebral soft tissues are unremarkable except for the presence of a good size hiatus hernia in the lower mediastinum.
IMPRESSION: 1.  No acute bony lesions of lumbar vertebrae. 

2.  At L4-5 level, significant degenerative disc disease and bilateral facet arthropathy are noted with minimal retrolisthesis.  Bulging annulus and facet arthropathy are causing moderately significant biforaminal stenosis.  AP diameter of thecal sac in the midline measures 12.7 mm. 

3.At L5-S1 level, degenerative disc disease and facet arthropathy are causing moderate biforaminal narrowing. Minimal anterolisthesis of L5 on S1 is noted.

4. Findings at other disc levels are described above in detail.

5. Paravertebral soft tissues show large hiatus hernia in the lower mediastinum.

## 2022-07-12 ENCOUNTER — Ambulatory Visit (HOSPITAL_COMMUNITY): Payer: Self-pay

## 2022-09-05 ENCOUNTER — Encounter (HOSPITAL_COMMUNITY): Payer: Self-pay | Admitting: PHYSICIAN ASSISTANT

## 2022-09-06 ENCOUNTER — Ambulatory Visit (HOSPITAL_COMMUNITY): Payer: Self-pay

## 2022-10-29 ENCOUNTER — Ambulatory Visit (HOSPITAL_COMMUNITY): Payer: Self-pay

## 2023-02-19 IMAGING — MR MRI HIP LT W/O CONTRAST
5 of 7 series · 25 of 40 positions shown · non-contrast
Comparison: None available.

﻿EXAM:  [id number]   MRI HIP LT W/O CONTRAST
INDICATION: Left hip pain.
TECHNIQUE: Noncontrast multiplanar, multisequence MRI was performed.

[Series 8: T2 fat-sat · axial · left · 4.5mm · 0.98mm/px · z∈[-49,+96]mm · 7 of 30 slices shown (1 of 3)]
[im 1/30]
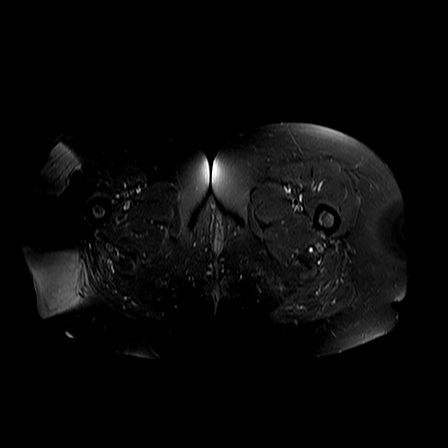
[im 5/30]
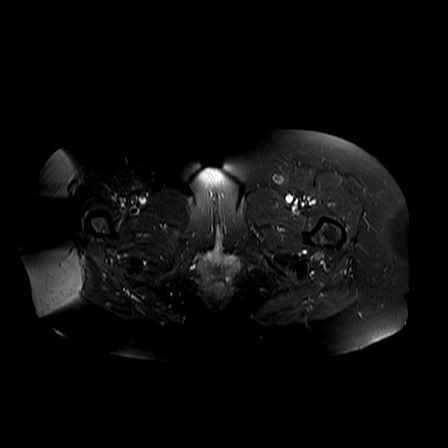
[im 10/30]
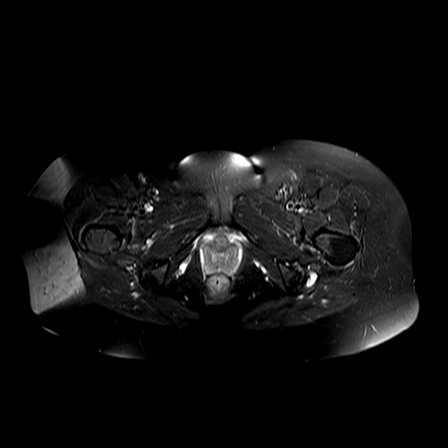
[im 15/30]
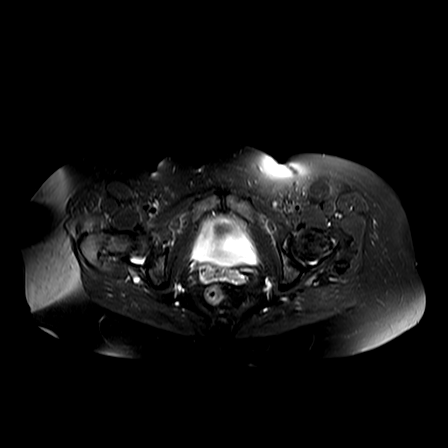
[im 20/30]
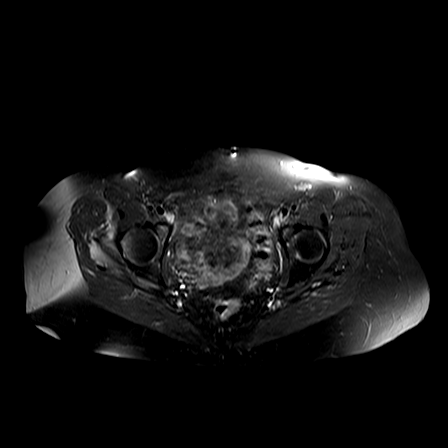
[im 25/30]
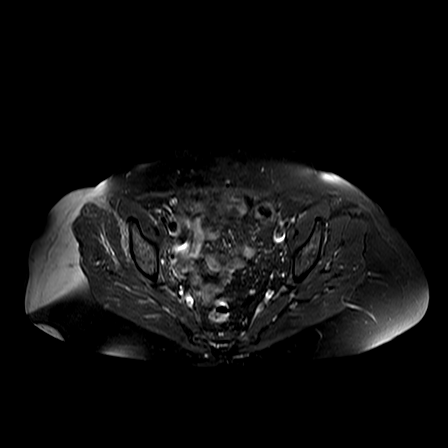
[im 30/30]
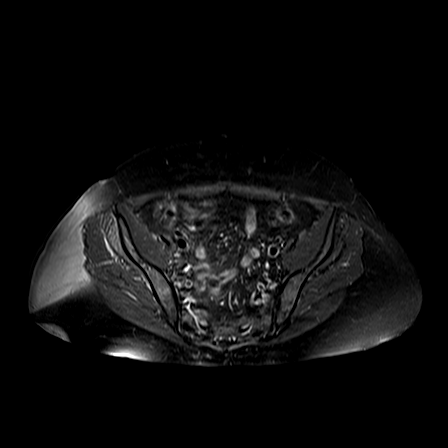

[Series 9: T1 · axial · left · 4.5mm · 0.86mm/px · z∈[-49,+96]mm · 7 of 30 slices shown (1 of 2)]
[im 1/30]
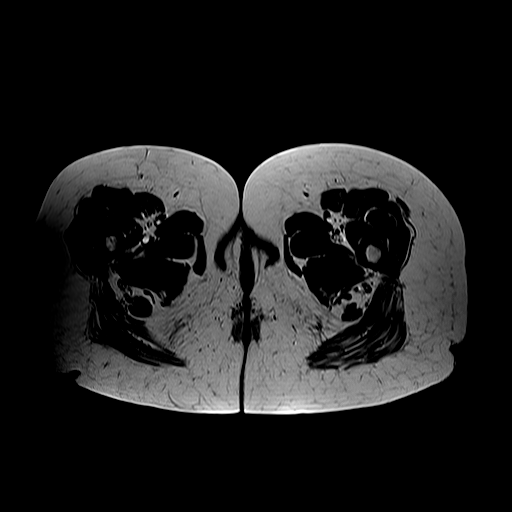
[im 5/30]
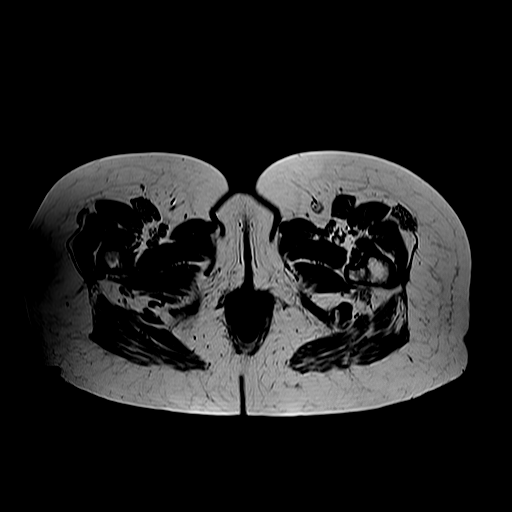
[im 10/30]
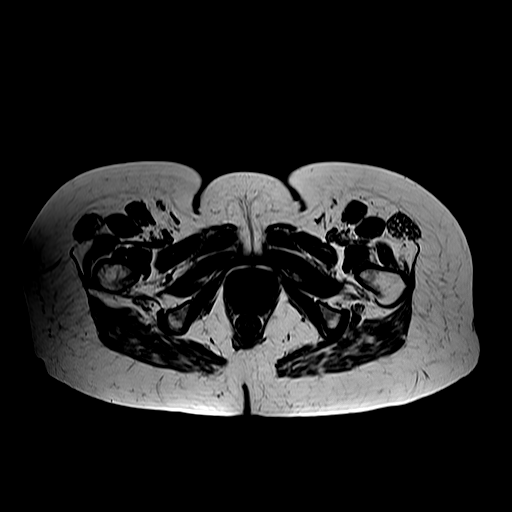
[im 15/30]
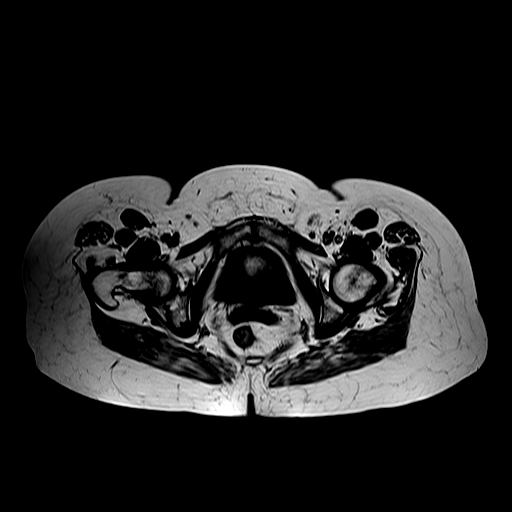
[im 20/30]
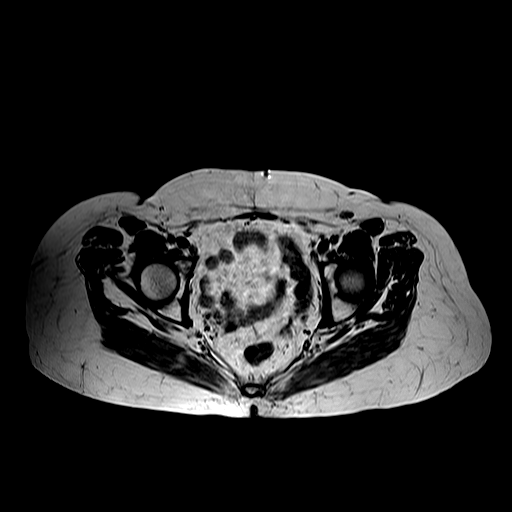
[im 25/30]
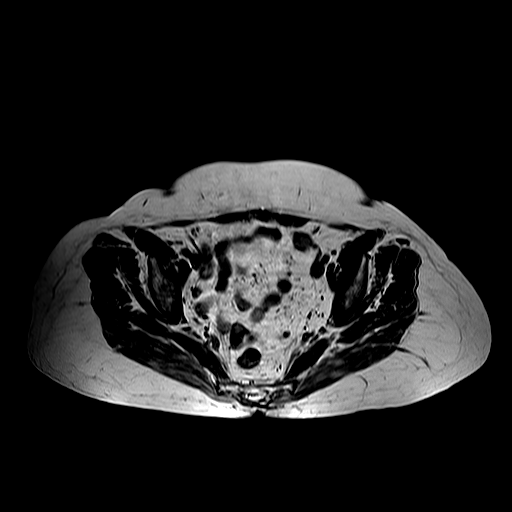
[im 30/30]
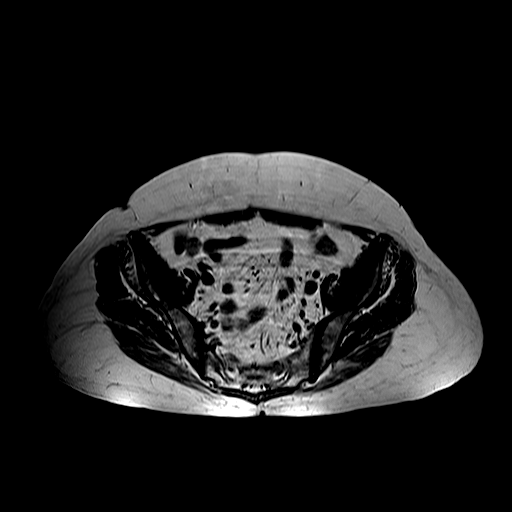

[Series 10: T1 · coronal · left · 4.5mm · 0.86mm/px · 1 of 20 slices shown (2 of 2)]
[im 1/20]
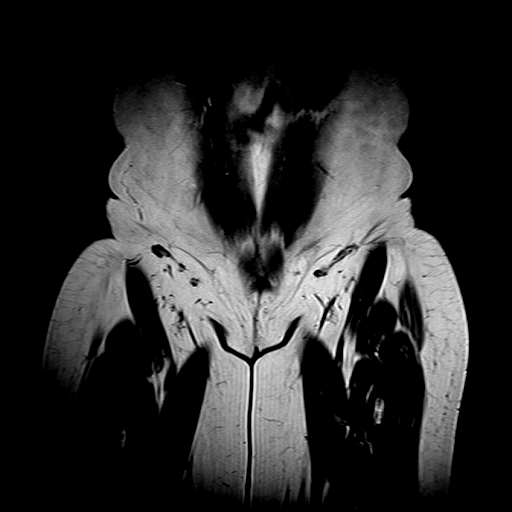

[Series 11: T2 fat-sat · coronal · left · 4.5mm · 0.86mm/px · 5 of 20 slices shown (2 of 3)]
[im 1/20]
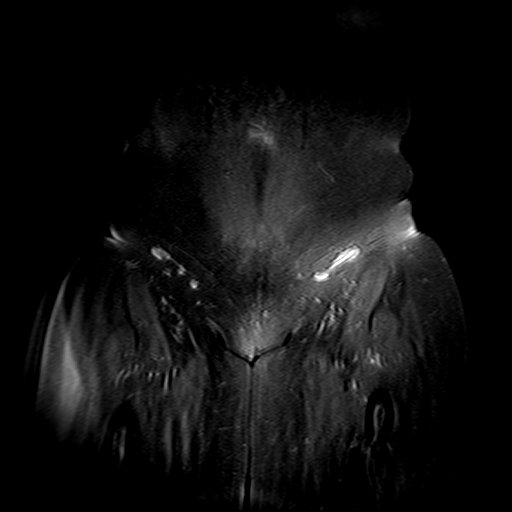
[im 5/20]
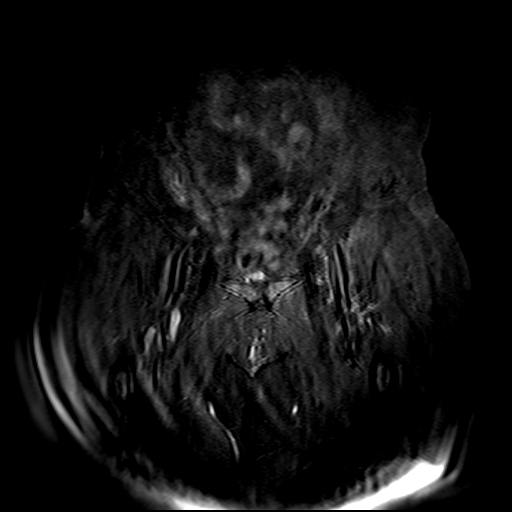
[im 10/20]
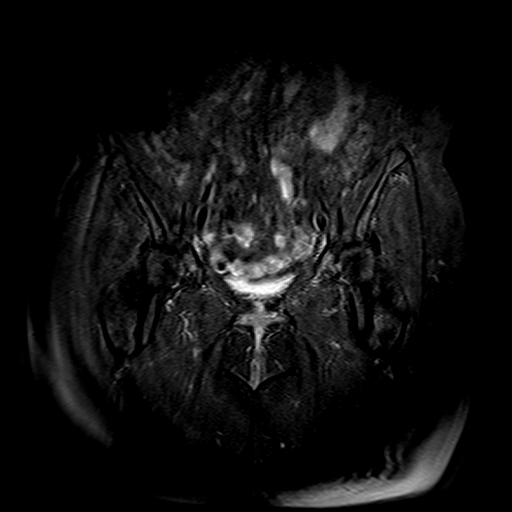
[im 15/20]
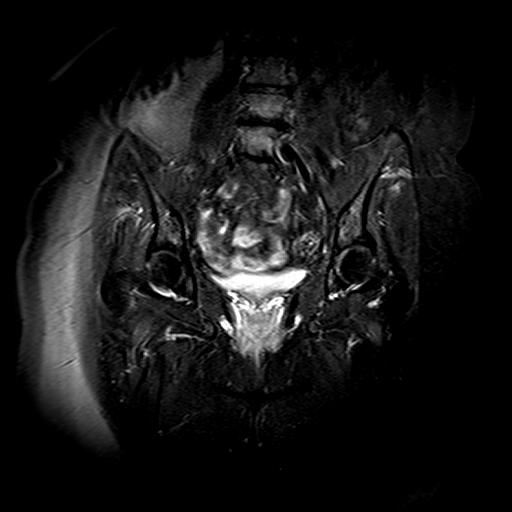
[im 20/20]
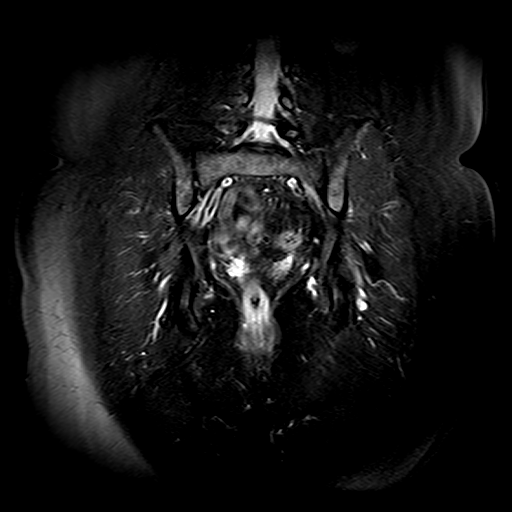

[Series 13: T2 fat-sat · sagittal · left · 4.5mm · 0.78mm/px · 5 of 20 slices shown (3 of 3)]
[im 1/20]
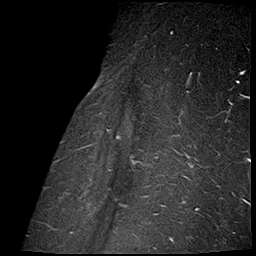
[im 5/20]
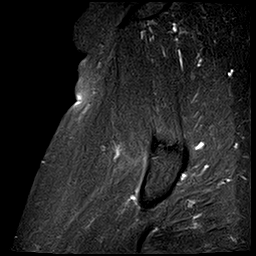
[im 10/20]
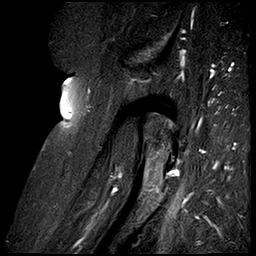
[im 15/20]
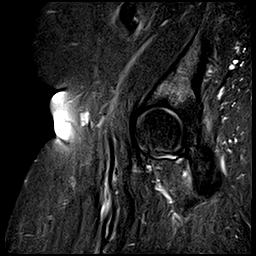
[im 20/20]
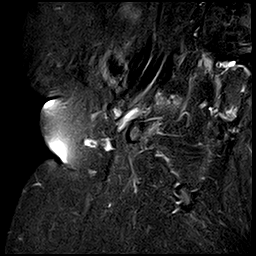

[25 of 40 positions shown; findings below may reference images not displayed]

FINDINGS: There are mild degenerative changes involving both hips.  

There is no fracture, stress fracture, dislocation, or significant marrow signal alteration.  There is no evidence of avascular necrosis.  

No muscle or tendon tear is seen.  There is no abnormal fluid collection or evidence of bursitis.
IMPRESSION: Mild osteoarthritis.

## 2023-02-19 IMAGING — CT CT SHOULDER RT W/O CONTRAST
3 series · 16 of 33 positions shown, 19 images · non-contrast
Comparison: None available.

﻿EXAM:  [id number]   CT SHOULDER RT W/O CONTRAST
INDICATION: Right scapular fracture, unable to raise arm.
TECHNIQUE: Noncontrast multiplanar spiral computed tomography was performed.  3D reconstructions were also obtained.  Exam was performed using one or more of the following dose reduction techniques: Automated exposure control, adjustment of the mA and/or kV according to patient size, or the use of iterative reconstruction technique. Radiation dose 690.2 mGy cm.

[axial rt shoulder · axial · 0.44mm/px · z∈[-253,-118]mm · 8 of 161 slices shown, 10 images]
[im 13/161  soft-tissue]
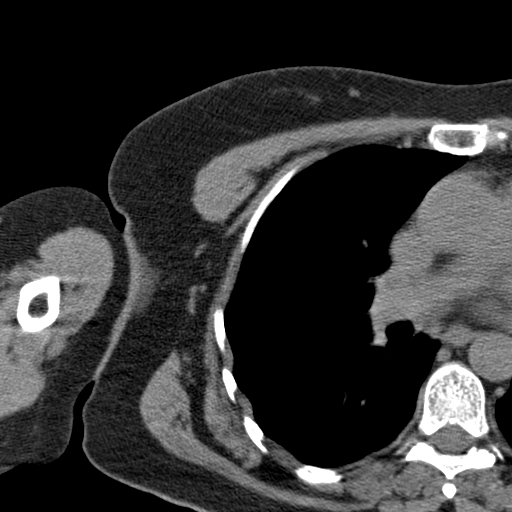
[im 13/161  bone]
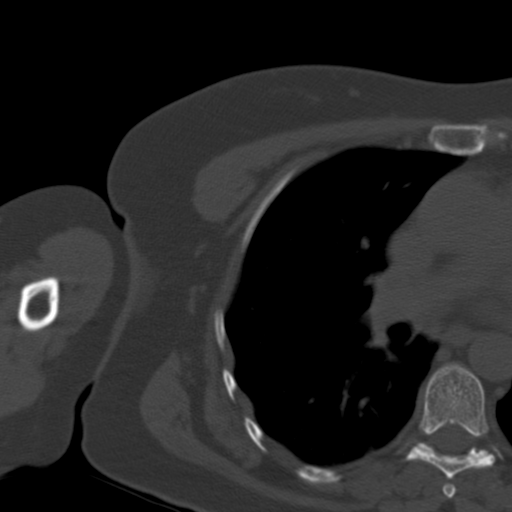
[im 37/161  bone]
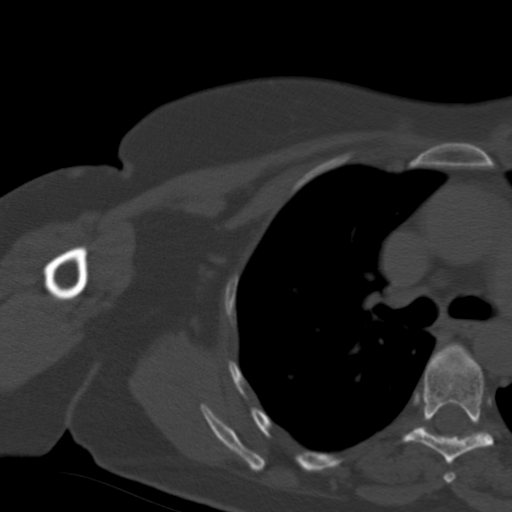
[im 50/161  bone]
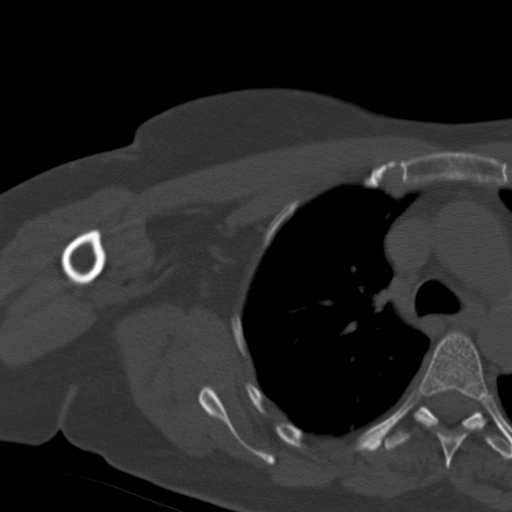
[im 74/161  bone]
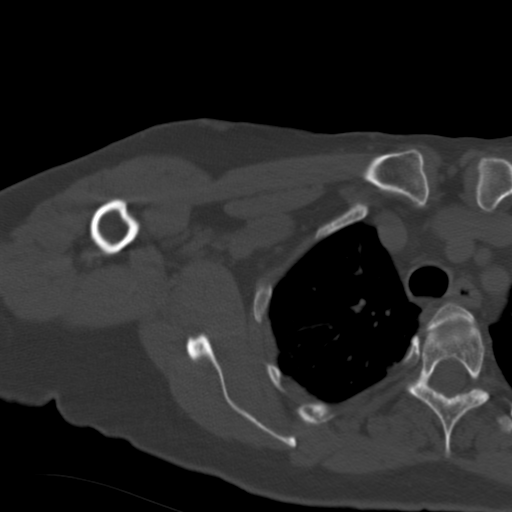
[im 87/161  soft-tissue]
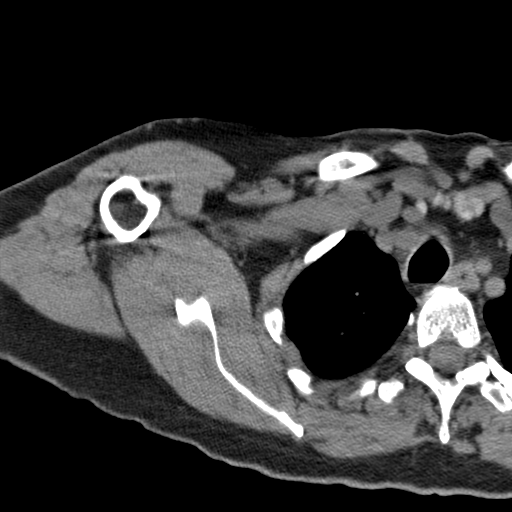
[im 87/161  bone]
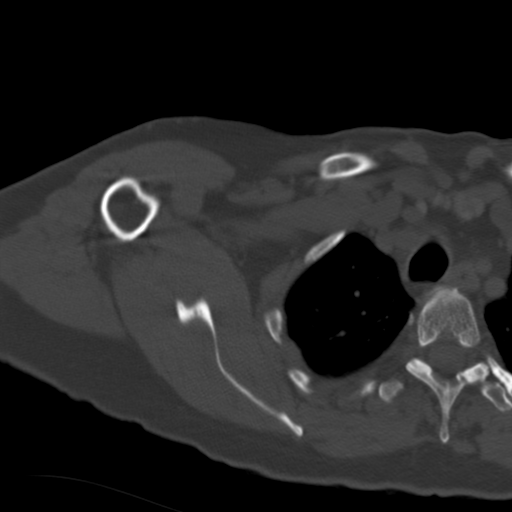
[im 111/161  bone]
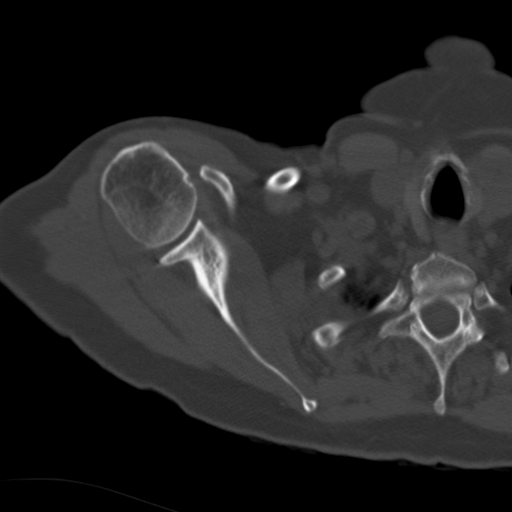
[im 124/161  bone]
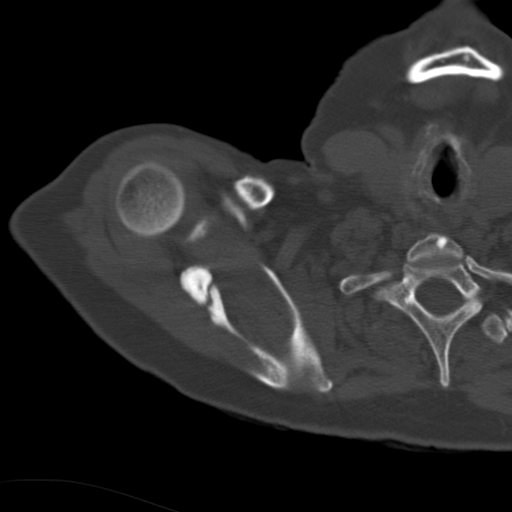
[im 148/161  bone]
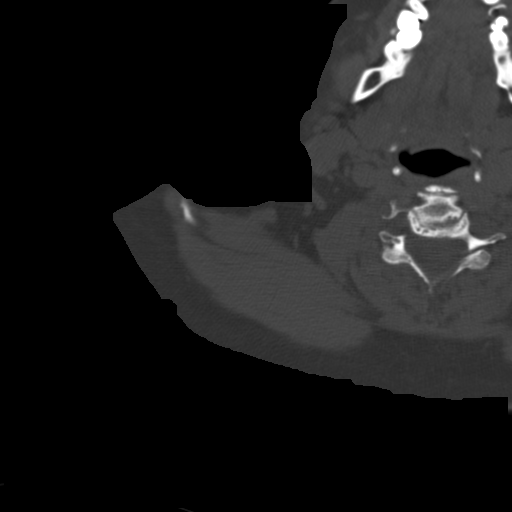

[cor · coronal · 0.44mm/px · 3 of 88 slices shown]
[im 18/88  bone]
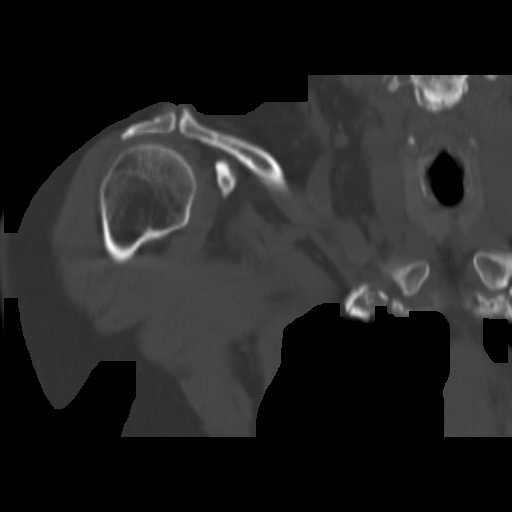
[im 35/88  bone]
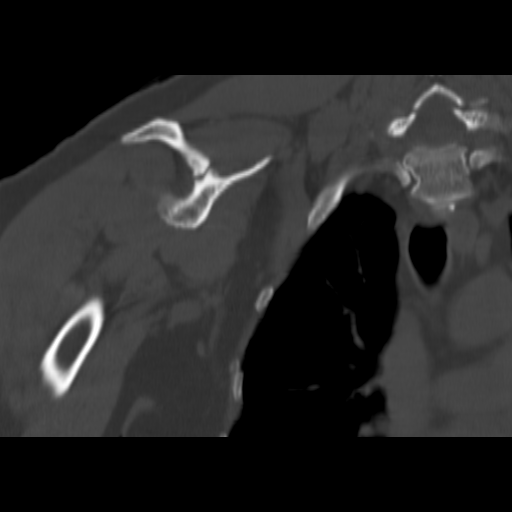
[im 53/88  bone]
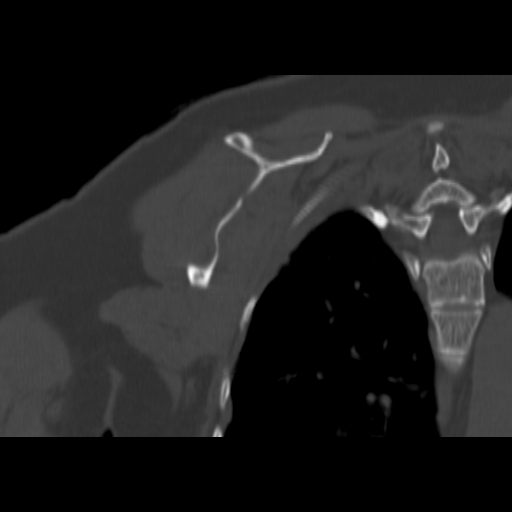

[sag · sagittal · 0.44mm/px · 5 of 87 slices shown, 6 images]
[im 29/87  bone]
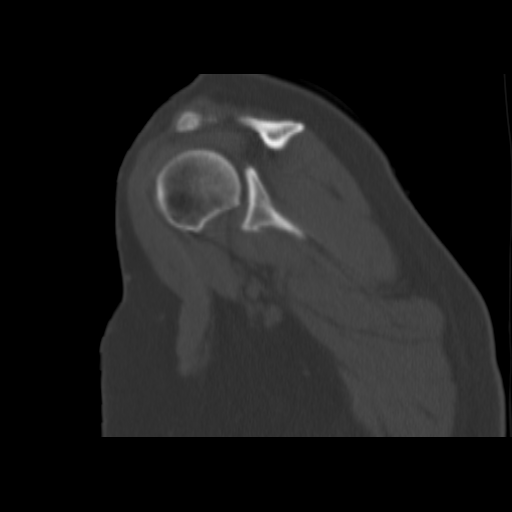
[im 36/87  bone]
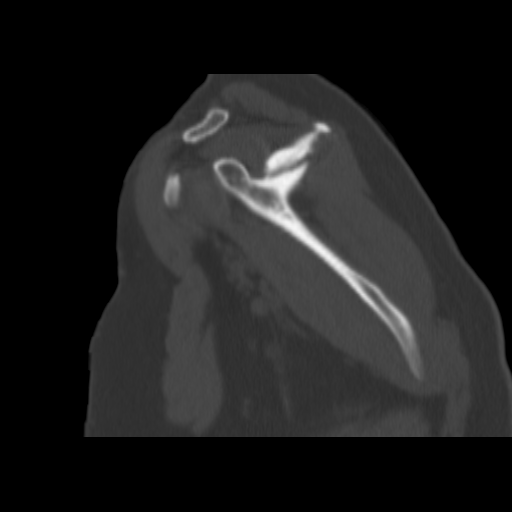
[im 44/87  soft-tissue]
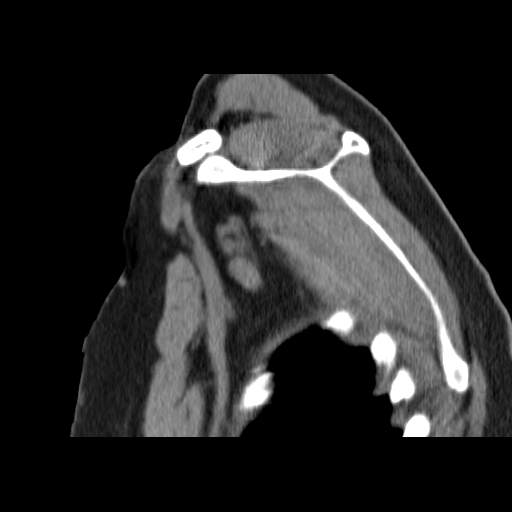
[im 44/87  bone]
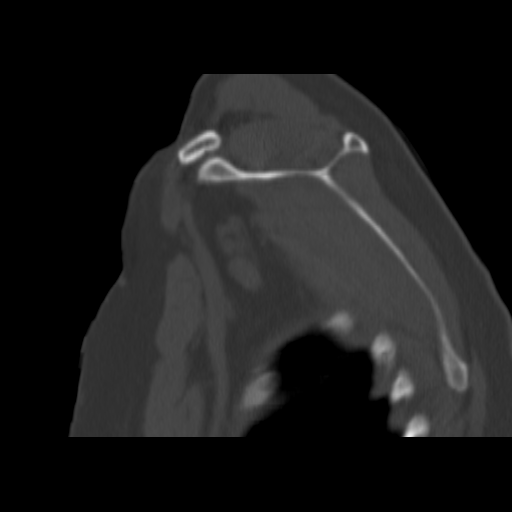
[im 51/87  bone]
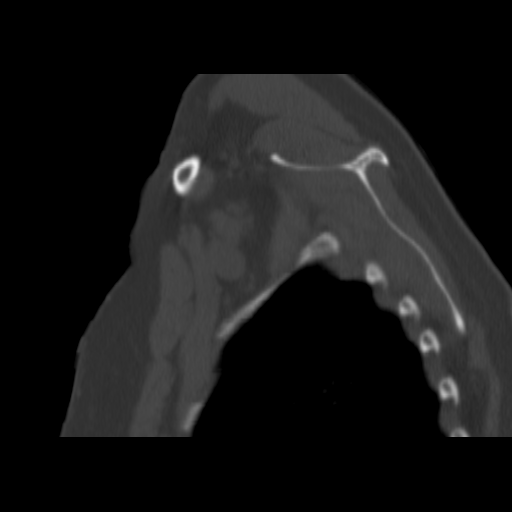
[im 58/87  bone]
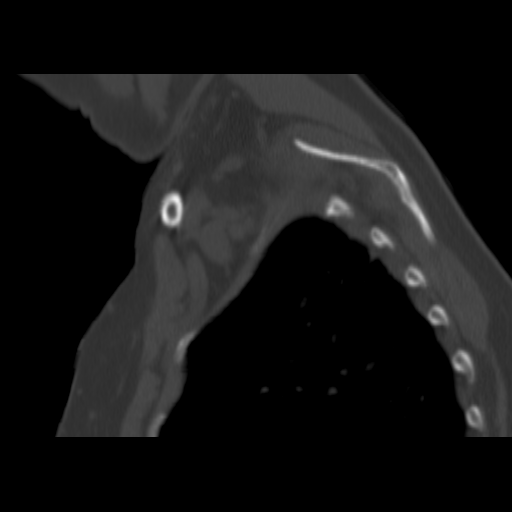

[16 of 33 positions shown; findings below may reference images not displayed]

FINDINGS: There is a nondisplaced fracture of the scapula at the base of the acromial process.  Most of the fracture line appears to be well corticated compatible with an old fracture with nonunion.  There also appears to be a small amount of bony callous formation indicative of partial healing of the superior aspect of this fracture line.  

There is mild reduction in the subacromial space.  The acromioclavicular joint and glenohumeral joint appear essentially unremarkable with no evidence of dislocation or separation.
IMPRESSION: 1. Nondisplaced scapular fracture at the base of the acromial process.  

2. This fracture appears to be predominantly old with nonunion, although there is a small amount of bony callous formation superiorly indicative of partial healing.

## 2023-04-04 IMAGING — MR MRI SHOULDER RT W/O CONTRAST
6 series · 38 of 40 positions shown · IV contrast (gadolinium)
Comparison: CT right shoulder dated 02/19/2023.

﻿EXAM:  [id number]   MRI SHOULDER RT W/O CONTRAST
INDICATION: Persistent right shoulder pain.  Impingement syndrome at right shoulder.  History of scapular fracture.
TECHNIQUE: Multiplanar, multisequential MRI of the right shoulder was performed without gadolinium contrast.

[Series 6: PD fat-sat · oblique · right · 4.0mm · 0.56mm/px · 8 of 22 slices shown (1 of 2)]
[im 1/22]
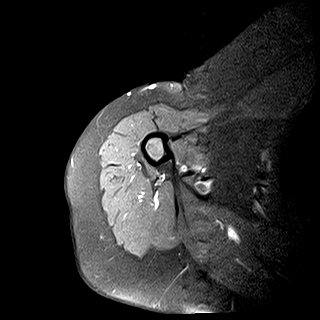
[im 4/22]
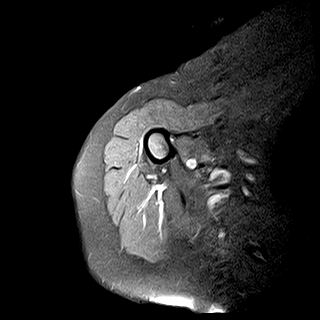
[im 7/22]
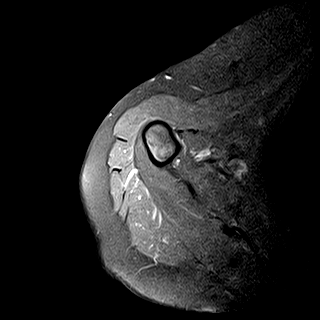
[im 10/22]
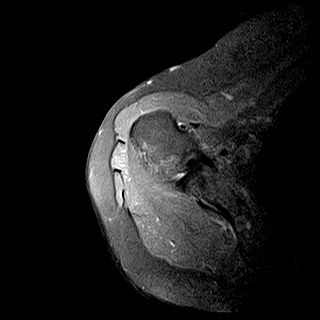
[im 13/22]
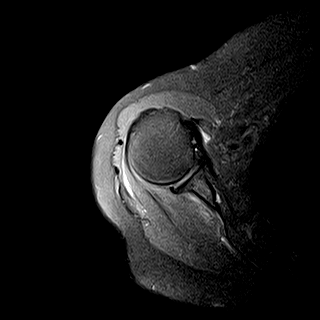
[im 16/22]
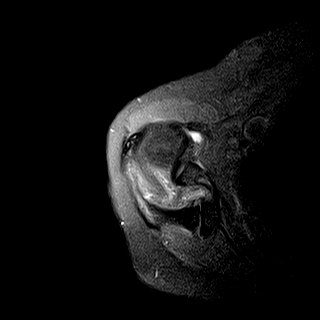
[im 19/22]
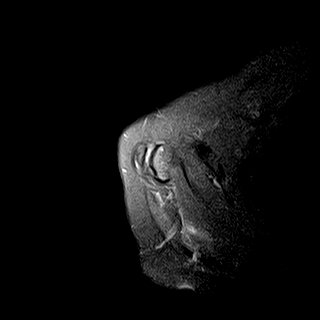
[im 22/22]
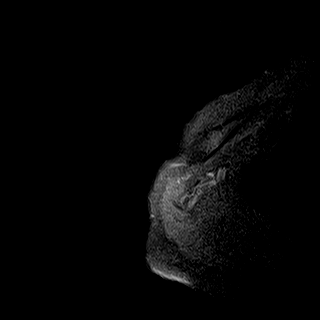

[Series 7: T2 fat-sat · oblique · right · 4.0mm · 0.42mm/px · 8 of 24 slices shown]
[im 1/24]
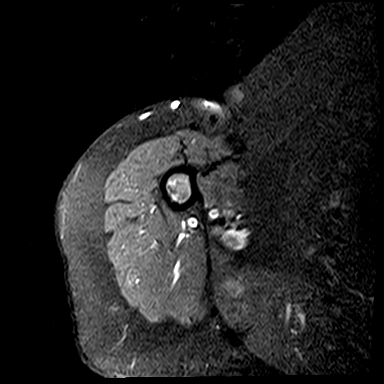
[im 4/24]
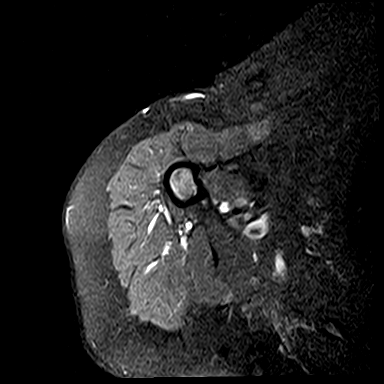
[im 7/24]
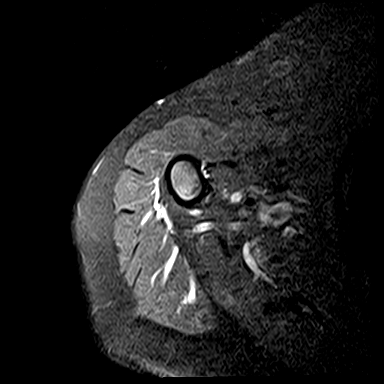
[im 10/24]
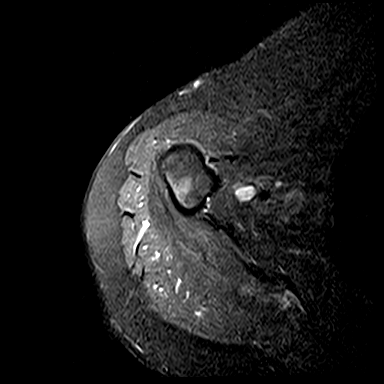
[im 14/24]
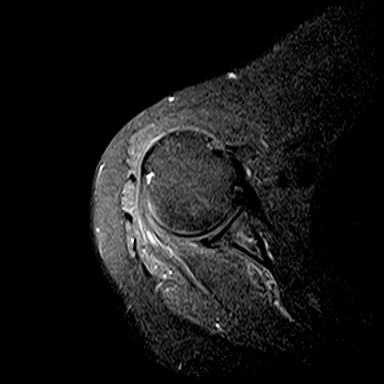
[im 17/24]
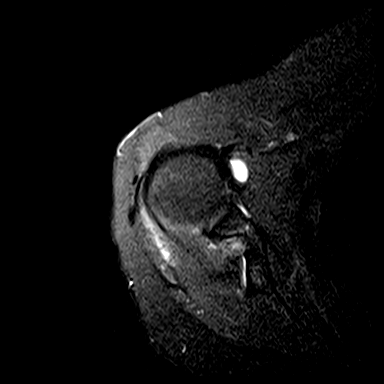
[im 20/24]
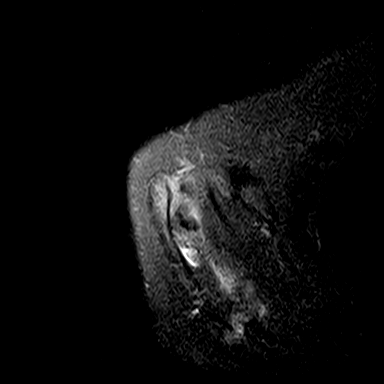
[im 24/24]
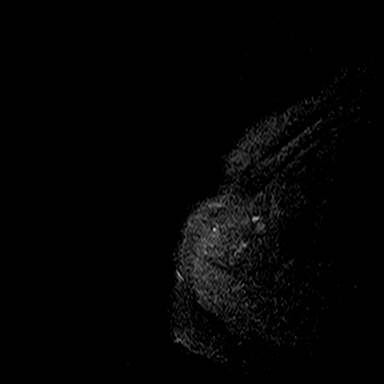

[Series 17: T1 · oblique · right · 3.5mm · 0.33mm/px · 6 of 20 slices shown]
[im 1/20]
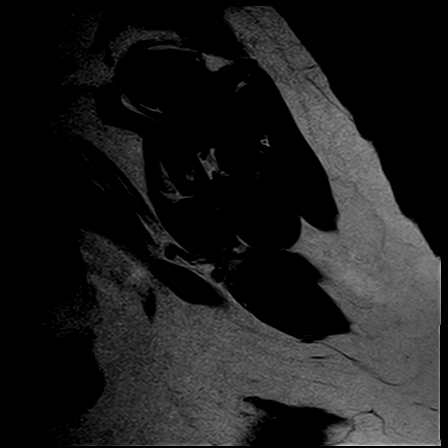
[im 4/20]
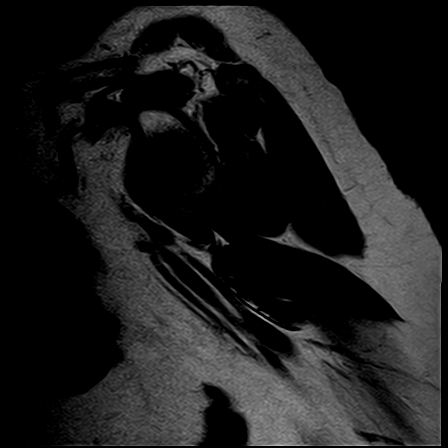
[im 8/20]
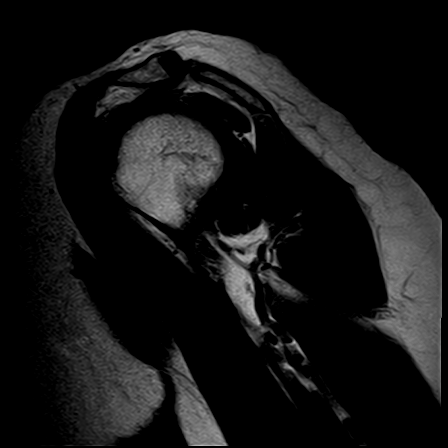
[im 12/20]
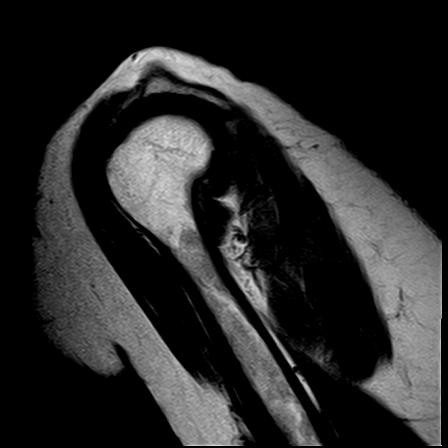
[im 16/20]
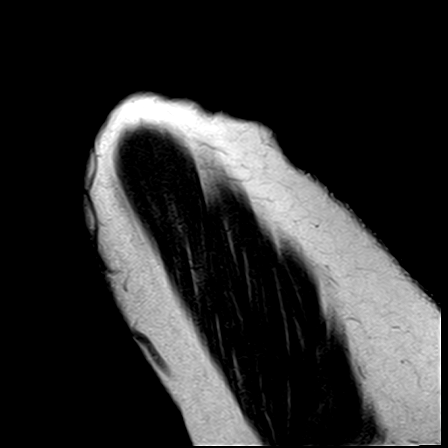
[im 20/20]
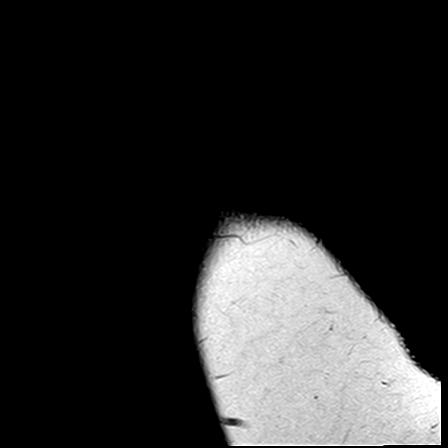

[Series 18: STIR · oblique · right · 3.5mm · 0.47mm/px · 6 of 18 slices shown (1 of 2)]
[im 1/18]
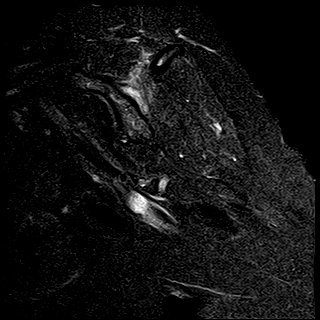
[im 4/18]
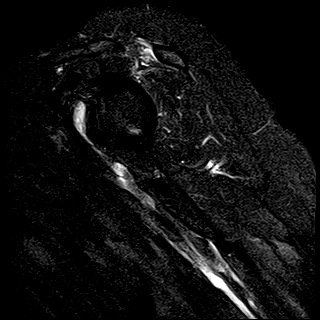
[im 7/18]
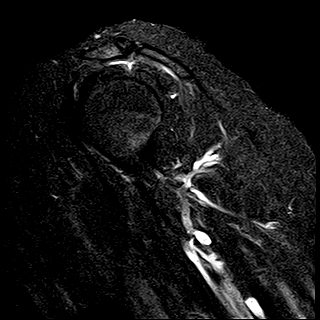
[im 11/18]
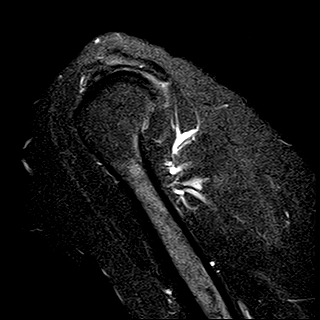
[im 14/18]
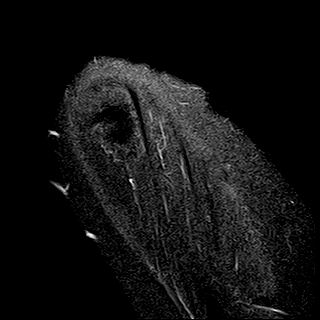
[im 18/18]
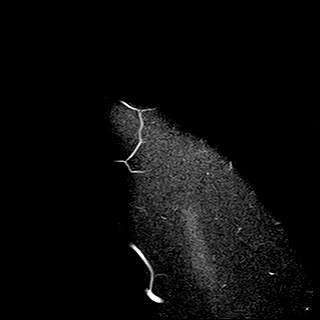

[Series 19: PD fat-sat · oblique · right · 4.0mm · 0.53mm/px · 6 of 20 slices shown (2 of 2)]
[im 1/20]
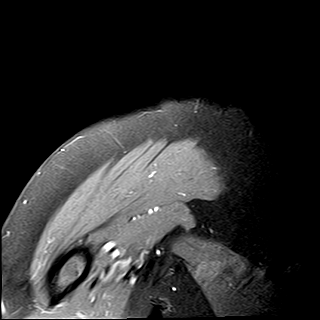
[im 4/20]
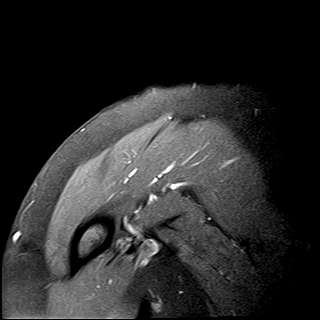
[im 8/20]
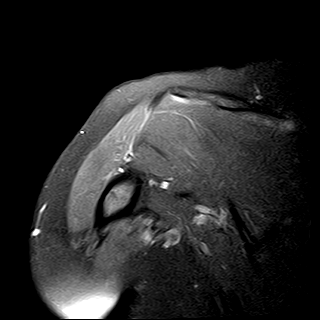
[im 12/20]
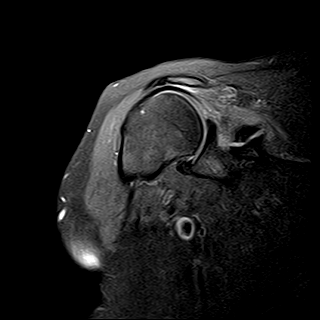
[im 16/20]
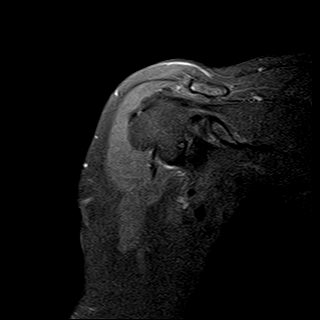
[im 20/20]
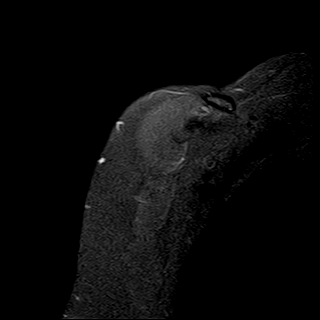

[Series 20: STIR · oblique · right · 4.0mm · 0.53mm/px · 4 of 20 slices shown (2 of 2)]
[im 1/20]
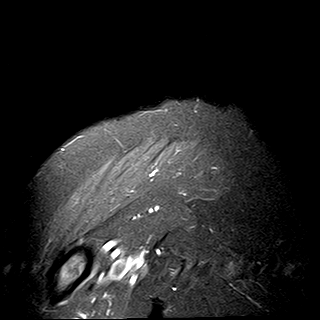
[im 4/20]
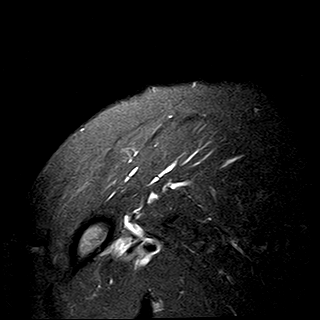
[im 8/20]
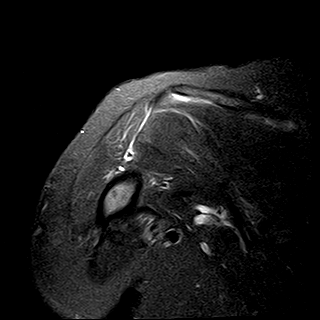
[im 12/20]
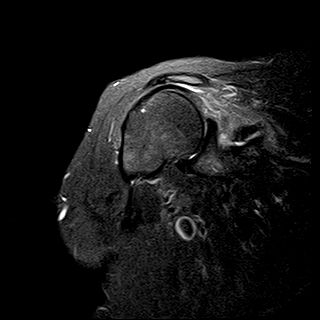

[38 of 40 positions shown; findings below may reference images not displayed]

FINDINGS: Evidence of nonunited fracture of the junction of the body of the scapula and the acromion process consistent with the CT findings.  No significant bone marrow edema is noted at this site.  There is mild bruising and edema of the soft tissues at the base of the acromion process involving the supraspinatus and infraspinatus muscles.  The alignment at the acromioclavicular joint is normal.

Hook shaped acromion process mildly impinges on the subacromion space and the supraspinatus.  Mild supraspinatus tendinopathy is noted without rotator cuff tear. Minimal effusion is noted in the subacromion bursa.

No evidence of labral tear is seen.  Long head of biceps tendon is intact.
IMPRESSION: 1. Nonunited fracture of the acromion process body of the scapula junction is noted.  There is evidence of bruising and edema of the surrounding soft tissues including the supraspinatus, infraspinatus muscle on both sides of the acromion process.

2. No evidence of rotator cuff tear.  Hook shaped acromion process is mildly impinging on the subacromion space and the supraspinatus tendon.  Minimal effusion is noted in the subacromion bursa.  

Electronically Signed by ALMAZAN, EYERUSALEM at [id number] [DATE]

## 2023-05-07 ENCOUNTER — Telehealth (HOSPITAL_BASED_OUTPATIENT_CLINIC_OR_DEPARTMENT_OTHER): Payer: Self-pay | Admitting: Orthopaedic Surgery

## 2023-06-03 ENCOUNTER — Encounter (HOSPITAL_COMMUNITY): Payer: Self-pay | Admitting: Family

## 2023-06-03 ENCOUNTER — Other Ambulatory Visit (HOSPITAL_COMMUNITY): Payer: Self-pay | Admitting: Family

## 2023-06-03 DIAGNOSIS — M7989 Other specified soft tissue disorders: Secondary | ICD-10-CM

## 2023-06-11 ENCOUNTER — Other Ambulatory Visit (HOSPITAL_COMMUNITY): Payer: Self-pay | Admitting: Family

## 2023-06-11 DIAGNOSIS — M7989 Other specified soft tissue disorders: Secondary | ICD-10-CM

## 2023-06-13 ENCOUNTER — Other Ambulatory Visit (HOSPITAL_BASED_OUTPATIENT_CLINIC_OR_DEPARTMENT_OTHER): Payer: Self-pay | Admitting: Orthopaedic Surgery

## 2023-06-13 DIAGNOSIS — S42109A Fracture of unspecified part of scapula, unspecified shoulder, initial encounter for closed fracture: Secondary | ICD-10-CM

## 2023-06-17 ENCOUNTER — Encounter (HOSPITAL_BASED_OUTPATIENT_CLINIC_OR_DEPARTMENT_OTHER): Payer: Self-pay

## 2023-06-17 ENCOUNTER — Ambulatory Visit (HOSPITAL_BASED_OUTPATIENT_CLINIC_OR_DEPARTMENT_OTHER): Payer: Self-pay | Admitting: Orthopaedic Surgery

## 2023-07-07 ENCOUNTER — Ambulatory Visit: Payer: BC Managed Care – PPO

## 2023-07-07 ENCOUNTER — Ambulatory Visit (HOSPITAL_COMMUNITY): Payer: Self-pay

## 2023-08-01 ENCOUNTER — Ambulatory Visit (HOSPITAL_COMMUNITY): Payer: BC Managed Care – PPO

## 2023-08-01 ENCOUNTER — Ambulatory Visit: Payer: BC Managed Care – PPO

## 2023-09-02 ENCOUNTER — Encounter (INDEPENDENT_AMBULATORY_CARE_PROVIDER_SITE_OTHER): Payer: Self-pay | Admitting: Surgery

## 2023-09-10 ENCOUNTER — Encounter (INDEPENDENT_AMBULATORY_CARE_PROVIDER_SITE_OTHER): Payer: Self-pay

## 2023-09-10 ENCOUNTER — Encounter (INDEPENDENT_AMBULATORY_CARE_PROVIDER_SITE_OTHER): Payer: Self-pay | Admitting: Surgery

## 2023-09-21 NOTE — H&P (Unsigned)
 GENERAL SURGERY, [facility]  [address]  [place] [address]    History and Physical    Name: Melinda Berg MRN:  [id number]   Date: 09/22/2023 DOB:  21-Jun-1960 (63 y.o.)                  Reason for Visit: No chief complaint on file.    History of Present Illness  Melinda Berg presents today***    Patient previously underwent an EGD by me July 2023 showing a previous fundoplication with a residual hiatal hernia and gastritis.            MEDICAL DECISION:  Review of the result(s) of each unique test:  Patient underwent diagnostic testing ( ***) prior to this dates visit.  I have personally reviewed the results and that serves as a component of the medical decision making for this encounter       Review of prior external note(s) from each unique source:  Patients referral to this office including a recent assessment by the referring provider.  This was reviewed by me for this unique office visit for the indication and intent of the referral as well as any pertinent medical or surgical history relevant to the patients independent evaluation by me today.        Patient Data  Patient History  Past Medical History:   Diagnosis Date    Asthma     Bone spur of other site     back    COPD (chronic obstructive pulmonary disease)     Diverticulitis     Esophageal reflux     Hypotension     Mixed hyperlipidemia     Osteoporosis     Personal history of colonic polyps     Tachycardia     Vitamin D deficiency          Past Surgical History:   Procedure Laterality Date    COLONOSCOPY      HX APPENDECTOMY      HX CHOLECYSTECTOMY      HX HYSTERECTOMY      HX TONSIL AND ADENOIDECTOMY      HX TONSILLECTOMY           Current Outpatient Medications   Medication Sig    alendronate (FOSAMAX) 70 mg Oral Tablet     EPINEPHrine 0.3 mg/0.3 mL Injection Auto-Injector Inject 0.3 mL (0.3 mg total) into the muscle    estradioL (ESTRACE) 1 mg Oral Tablet     famotidine (PEPCID) 40 mg Oral Tablet     furosemide  (LASIX) 80 mg Oral Tablet     metoprolol succinate (TOPROL-XL) 50 mg Oral Tablet Sustained Release 24 hr     midodrine (PROAMITINE) 5 mg Oral Tablet     pantoprazole (PROTONIX) 40 mg Oral Tablet, Delayed Release (E.C.)     potassium chloride (K-DUR) 20 mEq Oral Tab Sust.Rel. Particle/Crystal     VITAMIN D 1,250 mcg (50,000 unit) Oral Capsule      Allergies   Allergen Reactions    Meperidine Shortness of Breath    Penicillins Rash    Acetaminophen     Aspirin-Acetaminophen-Caffeine     Butorphanol      dizzy     Family Medical History:    None         Social History     Tobacco Use    Smoking status: Never    Smokeless tobacco: Never   Substance Use Topics  Alcohol use: Never    Drug use: Never            Physical Examination:  There were no vitals filed for this visit.   General: appropriate for age. in no acute distress.    Vital signs are present above and have been reviewed by me     HEENT: Atraumatic, Normocephalic.    Lungs: Nonlabored breathing with symmetric expansion    Heart:Regular wth respect to rate.    Abdomen:Soft. Nontender. Nondistended     Psychiatric: Alert and oriented to person, place, and time. affect appropriate      Assessment and Plan  No diagnosis found.      ***          I appreciate the opportunity to be involved in the care of your patients.  If you have any questions or concerns regarding this encounter, please do not hesitate to contact me at your convenience.      Alica Inks MD MBA CPE FACS     This note may have been partially generated using M[employer] Fluency Direct system, and there may be some incorrect words, spellings, and punctuation that were not noted in checking the note before saving, though effort was made to avoid such errors.

## 2023-09-22 ENCOUNTER — Other Ambulatory Visit: Payer: Self-pay

## 2023-09-22 ENCOUNTER — Ambulatory Visit (INDEPENDENT_AMBULATORY_CARE_PROVIDER_SITE_OTHER): Payer: Self-pay | Admitting: Surgery

## 2023-09-22 ENCOUNTER — Encounter (INDEPENDENT_AMBULATORY_CARE_PROVIDER_SITE_OTHER): Payer: Self-pay | Admitting: Surgery

## 2023-09-22 VITALS — BP 111/71 | HR 84 | Temp 96.4°F | Resp 18 | Ht 63.0 in | Wt 154.0 lb

## 2023-09-22 DIAGNOSIS — K449 Diaphragmatic hernia without obstruction or gangrene: Secondary | ICD-10-CM

## 2023-09-23 ENCOUNTER — Other Ambulatory Visit (INDEPENDENT_AMBULATORY_CARE_PROVIDER_SITE_OTHER): Payer: Self-pay | Admitting: Surgery

## 2023-09-23 DIAGNOSIS — K449 Diaphragmatic hernia without obstruction or gangrene: Secondary | ICD-10-CM

## 2024-01-23 ENCOUNTER — Other Ambulatory Visit (HOSPITAL_COMMUNITY): Payer: Self-pay

## 2024-01-23 DIAGNOSIS — E039 Hypothyroidism, unspecified: Secondary | ICD-10-CM

## 2024-01-23 DIAGNOSIS — E041 Nontoxic single thyroid nodule: Secondary | ICD-10-CM

## 2024-02-15 ENCOUNTER — Other Ambulatory Visit: Payer: Self-pay

## 2024-02-15 ENCOUNTER — Ambulatory Visit
Admission: RE | Admit: 2024-02-15 | Discharge: 2024-02-15 | Disposition: A | Discharge status: Home / Self Care | Source: Ambulatory Visit

## 2024-02-15 DIAGNOSIS — E039 Hypothyroidism, unspecified: Secondary | ICD-10-CM | POA: Insufficient documentation

## 2024-02-15 DIAGNOSIS — E041 Nontoxic single thyroid nodule: Secondary | ICD-10-CM | POA: Insufficient documentation

## 2024-02-16 DIAGNOSIS — E039 Hypothyroidism, unspecified: Secondary | ICD-10-CM

## 2024-02-16 DIAGNOSIS — E042 Nontoxic multinodular goiter: Secondary | ICD-10-CM
